# Patient Record
Sex: Female | Born: 1970
Health system: Southern US, Community
[De-identification: ages and names within clinical notes are randomized; demographics above are authoritative.]

## PROBLEM LIST (undated history)

## (undated) DIAGNOSIS — T7840XA Allergy, unspecified, initial encounter: Secondary | ICD-10-CM

## (undated) DIAGNOSIS — Z9989 Dependence on other enabling machines and devices: Secondary | ICD-10-CM

## (undated) DIAGNOSIS — F419 Anxiety disorder, unspecified: Secondary | ICD-10-CM

## (undated) DIAGNOSIS — G4730 Sleep apnea, unspecified: Secondary | ICD-10-CM

## (undated) DIAGNOSIS — Z9884 Bariatric surgery status: Secondary | ICD-10-CM

## (undated) DIAGNOSIS — K8010 Calculus of gallbladder with chronic cholecystitis without obstruction: Secondary | ICD-10-CM

## (undated) DIAGNOSIS — G4733 Obstructive sleep apnea (adult) (pediatric): Secondary | ICD-10-CM

## (undated) DIAGNOSIS — E785 Hyperlipidemia, unspecified: Secondary | ICD-10-CM

## (undated) DIAGNOSIS — Z8669 Personal history of other diseases of the nervous system and sense organs: Secondary | ICD-10-CM

## (undated) DIAGNOSIS — G43909 Migraine, unspecified, not intractable, without status migrainosus: Secondary | ICD-10-CM

## (undated) DIAGNOSIS — K760 Fatty (change of) liver, not elsewhere classified: Secondary | ICD-10-CM

## (undated) DIAGNOSIS — K219 Gastro-esophageal reflux disease without esophagitis: Secondary | ICD-10-CM

## (undated) DIAGNOSIS — R51 Headache: Secondary | ICD-10-CM

## (undated) DIAGNOSIS — I10 Essential (primary) hypertension: Secondary | ICD-10-CM

## (undated) DIAGNOSIS — Z8619 Personal history of other infectious and parasitic diseases: Secondary | ICD-10-CM

## (undated) DIAGNOSIS — E559 Vitamin D deficiency, unspecified: Secondary | ICD-10-CM

## (undated) HISTORY — DX: Allergy, unspecified, initial encounter: T78.40XA

## (undated) HISTORY — DX: Personal history of other diseases of the nervous system and sense organs: Z86.69

## (undated) HISTORY — DX: Morbid (severe) obesity due to excess calories: E66.01

## (undated) HISTORY — DX: Dependence on other enabling machines and devices: Z99.89

## (undated) HISTORY — DX: Essential (primary) hypertension: I10

## (undated) HISTORY — DX: Obstructive sleep apnea (adult) (pediatric): G47.33

## (undated) HISTORY — DX: Anxiety disorder, unspecified: F41.9

## (undated) HISTORY — DX: Personal history of other infectious and parasitic diseases: Z86.19

## (undated) HISTORY — DX: Sleep apnea, unspecified: G47.30

---
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
## 1999-07-23 ENCOUNTER — Other Ambulatory Visit: Admission: RE | Admit: 1999-07-23 | Discharge: 1999-07-23 | Payer: Self-pay | Admitting: *Deleted

## 2002-12-12 ENCOUNTER — Other Ambulatory Visit: Admission: RE | Admit: 2002-12-12 | Discharge: 2002-12-12 | Payer: Self-pay | Admitting: Obstetrics and Gynecology

## 2003-03-02 ENCOUNTER — Ambulatory Visit (HOSPITAL_COMMUNITY): Admission: RE | Admit: 2003-03-02 | Discharge: 2003-03-02 | Payer: Self-pay | Admitting: Obstetrics and Gynecology

## 2003-03-02 ENCOUNTER — Encounter: Payer: Self-pay | Admitting: Obstetrics and Gynecology

## 2003-06-15 ENCOUNTER — Inpatient Hospital Stay (HOSPITAL_COMMUNITY): Admission: AD | Admit: 2003-06-15 | Discharge: 2003-06-15 | Payer: Self-pay | Admitting: Obstetrics and Gynecology

## 2003-06-23 ENCOUNTER — Inpatient Hospital Stay (HOSPITAL_COMMUNITY): Admission: AD | Admit: 2003-06-23 | Discharge: 2003-06-27 | Payer: Self-pay | Admitting: Obstetrics and Gynecology

## 2003-06-24 ENCOUNTER — Encounter (INDEPENDENT_AMBULATORY_CARE_PROVIDER_SITE_OTHER): Payer: Self-pay | Admitting: *Deleted

## 2003-06-28 ENCOUNTER — Encounter: Admission: RE | Admit: 2003-06-28 | Discharge: 2003-07-28 | Payer: Self-pay | Admitting: Obstetrics and Gynecology

## 2003-06-30 ENCOUNTER — Inpatient Hospital Stay (HOSPITAL_COMMUNITY): Admission: AD | Admit: 2003-06-30 | Discharge: 2003-06-30 | Payer: Self-pay | Admitting: Obstetrics and Gynecology

## 2003-07-03 ENCOUNTER — Inpatient Hospital Stay (HOSPITAL_COMMUNITY): Admission: AD | Admit: 2003-07-03 | Discharge: 2003-07-03 | Payer: Self-pay | Admitting: Obstetrics and Gynecology

## 2003-07-25 ENCOUNTER — Other Ambulatory Visit: Admission: RE | Admit: 2003-07-25 | Discharge: 2003-07-25 | Payer: Self-pay | Admitting: Obstetrics and Gynecology

## 2005-03-11 ENCOUNTER — Other Ambulatory Visit: Admission: RE | Admit: 2005-03-11 | Discharge: 2005-03-11 | Payer: Self-pay | Admitting: Family Medicine

## 2006-09-23 ENCOUNTER — Other Ambulatory Visit: Admission: RE | Admit: 2006-09-23 | Discharge: 2006-09-23 | Payer: Self-pay | Admitting: Family Medicine

## 2008-01-03 ENCOUNTER — Other Ambulatory Visit: Admission: RE | Admit: 2008-01-03 | Discharge: 2008-01-03 | Payer: Self-pay | Admitting: Family Medicine

## 2009-11-07 ENCOUNTER — Observation Stay (HOSPITAL_COMMUNITY): Admission: EM | Admit: 2009-11-07 | Discharge: 2009-11-07 | Payer: Self-pay | Admitting: Emergency Medicine

## 2009-12-29 HISTORY — PX: TUBAL LIGATION: SHX77

## 2010-06-02 ENCOUNTER — Inpatient Hospital Stay (HOSPITAL_COMMUNITY): Admission: AD | Admit: 2010-06-02 | Discharge: 2010-06-03 | Payer: Self-pay | Admitting: Obstetrics

## 2010-06-02 ENCOUNTER — Ambulatory Visit: Payer: Self-pay | Admitting: Advanced Practice Midwife

## 2010-07-26 ENCOUNTER — Ambulatory Visit (HOSPITAL_COMMUNITY): Admission: RE | Admit: 2010-07-26 | Discharge: 2010-07-26 | Payer: Self-pay | Admitting: Obstetrics and Gynecology

## 2011-03-15 LAB — BASIC METABOLIC PANEL
BUN: 10 mg/dL (ref 6–23)
CO2: 28 mEq/L (ref 19–32)
Calcium: 9.6 mg/dL (ref 8.4–10.5)
Chloride: 99 mEq/L (ref 96–112)
Creatinine, Ser: 0.8 mg/dL (ref 0.4–1.2)
GFR calc Af Amer: >60 mL/min (ref >60)
GFR calc non Af Amer: >60 mL/min (ref >60)
Glucose, Bld: 98 mg/dL (ref 70–99)
Potassium: 3.2 mEq/L — ABNORMAL LOW (ref 3.5–5.1)
Sodium: 134 mEq/L — ABNORMAL LOW (ref 135–145)

## 2011-03-15 LAB — PREGNANCY, URINE: Preg Test, Ur: NEGATIVE

## 2011-03-15 LAB — CBC
HCT: 38.9 % (ref 36.0–46.0)
Hemoglobin: 13 g/dL (ref 12.0–15.0)
MCH: 28.1 pg (ref 26.0–34.0)
MCHC: 33.3 g/dL (ref 30.0–36.0)
MCV: 84.4 fL (ref 78.0–100.0)
Platelets: 324 10*3/uL (ref 150–400)
RBC: 4.61 MIL/uL (ref 3.87–5.11)
RDW: 15.2 % (ref 11.5–15.5)
WBC: 9 10*3/uL (ref 4.0–10.5)

## 2011-03-17 LAB — GC/CHLAMYDIA PROBE AMP, GENITAL: Chlamydia, DNA Probe: NEGATIVE

## 2011-03-17 LAB — WET PREP, GENITAL
Trich, Wet Prep: NONE SEEN
Yeast Wet Prep HPF POC: NONE SEEN

## 2011-05-16 NOTE — H&P (Signed)
[name], [name]                        ACCOUNT NO.:  [id number]   MEDICAL RECORD NO.:  [id number]                   PATIENT TYPE:  INP   LOCATION:  9175                                 F[facility]ILITY:  [facility]   PHYSICIAN:  Maxie Better, M.D.            DATE OF BIRTH:  December 21, 1971   DATE OF ADMISSION:  06/23/2003  DATE OF DISCHARGE:                                HISTORY & PHYSICAL   CHIEF COMPLAINT:  1. Elevated blood pressure.  2. Induction for superimposed preeclampsia.   HISTORY OF PRESENT ILLNESS:  This is a 40 year old gravida 2, para 0-0-1-0,  married black female, last menstrual period of October 01, 2002, [facility] of July 09, 2003, who is now 37-5/[redacted] weeks gestation, being admitted for induction of  labor secondary to superimposed preeclampsia.  The patient presented for her  obstetrical care on June 23, 2003, at which time her blood pressure was  noted to be 160/90.  The patient denies any headaches or blurring of her  vision.  She has noted leg swelling.  She denies any epigastric pain.   LABORATORY DATA:  On June 16, 2003, had revealed SGOT of 27, uric acid of  6.8, creatinine of 0.9, platelet count of 296,000, hematocrit of 33.5.  She  had a 24 hour urine collection on June 16, 2003, that showed 360 mg of  protein and a 24 hour urine creatinine clearance of 135 ml/min.   The patient's first trimester blood pressure had been 140/98, which is found  to be typically with borderline high blood pressure.  Ultrasound on June 02, 2003, showed an estimated fetal weight of 5 pounds 3 ounces, which is at the  26th percentile.  Amniotic fluid index of 13.9.   PRENATAL CARE:  [facility].   PRIMARY OBSTETRICIAN:  Maxie Better, M.D.   PRENATAL LABORATORY DATA:  Blood type is A positive, antibody screen is  negative.  Hemoglobin electrophoresis is normal.  RPR is nonreactive.  Rubella is immune.  Hepatitis B surface antigen is negative.  HIV test is  negative.  GC and  Chlamydia cultures were negative.  Pap was normal.  AFP 3  test on February 02, 2003, was normal.  Normal anatomic fetal survey at  [facility].  Group B Strep culture was positive.  Glucose challenge  test was normal.   ALLERGIES:  No known drug allergies.   MEDICATIONS:  Prenatal vitamins.   PAST MEDICAL HISTORY:  Borderline hypertension.   PAST SURGICAL HISTORY:  D&E.   OBSTETRICAL HISTORY:  In May 1988, first trimester elective termination, no  complications.   FAMILY HISTORY:  Hypertension in mother and father, uncles and aunts.   SOCIAL HISTORY:  Single, involved, Investment banker, operational.  No  children.   REVIEW OF SYSTEMS:  As per HPI, otherwise negative.   PHYSICAL EXAMINATION:  GENERAL:  A well-developed, well-nourished gravid  black female in no acute distress.  VITAL  SIGNS:  Blood pressure 160/90, weight 250 pounds, fetal heart rate  160.  SKIN:  No lesions.  HEENT:  Anicteric sclerae, pink conjunctivae, oropharynx negative.  HEART:  Regular rate and rhythm with a grade 2 to 3/6 systolic ejection  murmur.  BREASTS:  Soft, nontender, no palpable mass.  ABDOMEN:  Gravid.  Fundal height is 43 cm.  PELVIC:  Soft, closed, vertex, -3.  EXTREMITIES:  There is 1 to 2+ edema, deep tendon reflexes 2+.   IMPRESSION:  1. Superimposed mild preeclampsia.  2. Group B Strep culture positive.  3. Intrauterine gestation at 37+ weeks.   PLAN:  1. Admission.  2. Repeat pregnancy induced hypertension labs.  3. A 24 hour urine creatinine clearance and urine protein collection.  4. Cervical ripening with Cytotec.  5. Low-dose Pitocin.  6. Penicillin prophylaxis.  7. Analgesics p.r.n.                                               Maxie Better, M.D.    [place]/MEDQ  D:  06/23/2003  T:  06/23/2003  Job:  [id number]

## 2011-05-16 NOTE — Discharge Summary (Signed)
[name], [name]                        ACCOUNT NO.:  [id number]   MEDICAL RECORD NO.:  [id number]                   PATIENT TYPE:  INP   LOCATION:  9130                                 [facility]ILITY:  [facility]   PHYSICIAN:  Maxie Better, M.D.            DATE OF BIRTH:  January 15, 1971   DATE OF ADMISSION:  06/23/2003  DATE OF DISCHARGE:  06/27/2003                                 DISCHARGE SUMMARY   ADMISSION DIAGNOSES:  1. Chronic hypertension.  2. Superimposed preeclampsia.  3. Group B strep culture positive.  4. Intrauterine gestation at 68 and five-sevenths weeks.   DISCHARGE DIAGNOSES:  1. Intrauterine gestation at 74 and five-sevenths weeks, delivered.  2. Nonreassuring fetal status.  3. Superimposed preeclampsia.  4. Direct occiput posterior presentation.  5. Postoperative anemia.   PROCEDURE:  Primary cesarean section.   HISTORY OF PRESENT ILLNESS:  This is a 40 year old gravida 2 para 0-0-1-0  married black female at 42 and five-sevenths weeks gestation admitted for  induction secondary to superimposed preeclampsia.  Please see the dictated  H&P for the specific details.   HOSPITAL COURSE:  The patient was admitted to [facility].  She  underwent cervical ripening using Cytotec.  She was noted to be group B  strep culture positive and therefore was started on penicillin.  Magnesium  sulfate was started with the onset of labor.  Low-dose Pitocin was started  on the following morning.  Artificial rupture of membranes was performed at  1 cm dilatation, 80% effaced, and -3.  Clear fluid was noted and internal  scalp electrode was placed at the time for question of deceleration on  external monitoring.  The patient received an epidural at 3 cm, 90%, -3 to -  2, vertex presentation.  Intrauterine pressure catheter was placed.  There  were variable decelerations noted.  Amnioinfusion was started and the  Pitocin was continued.  The patient progressed to 4 cm, 90%, -1;  however,  repetitive late decelerations were noted.  Pitocin was discontinued,  maternal oxygenation was started, positional change was done, but no change  in the tracing was noted and therefore decision was made to perform a  primary cesarean section.  Admission labs had revealed hematocrit of 35.2;  platelet count of 273,000; uric acid of 6.9.  AST was 37, LDH was 220.  The  patient was taken to the operating room where she underwent a primary  cesarean section with the resultant delivery of a live female with the cord  around the neck x1 in the direct occiput posterior presentation, Apgars of 9  and 9, cord pH of 7.23.  The placenta was spontaneous, intact.  It was sent  to pathology and was noted to be a mature placenta.  Normal tubes and  ovaries were noted at the time of surgery.  The weight of the baby was 6  pounds 14 ounces, Apgars of 9 and 9.  Postoperatively  the patient did well.  Her blood pressure during her hospitalization remained between 130 to 150  over 60 to 90.  She was continued on her magnesium sulfate postoperatively  until she was diuresing well.  A CBC on postoperative day #1 showed a  hemoglobin of 10, hematocrit of 30, and a white count of 9.9.  By  postoperative day #3 the patient was off magnesium sulfate, had passed  flatus, was tolerating a regular diet.  Her blood pressures had been stable.  Her incision was dry and intact.  Her uterus was firm at the umbilicus and  nontender and she had 1+ pitting edema bilaterally with no clonus.  A repeat  of her PIH labs showed a mild elevation of her SGOT, uric acid increased to  6.7.  The patient was deemed well otherwise to be discharged home.   DISPOSITION:  Home.   CONDITION:  Stable.   DISCHARGE MEDICATIONS:  1. Tylox #30 one p.o. q.4h. p.r.n. pain.  2. Motrin 800 mg one p.o. q.6h. p.r.n. pain.  3. Over-the-counter iron supplementation one p.o. b.i.d.   DISCHARGE INSTRUCTIONS:  1. Call for temperature greater  than or equal to 100.4  2. Nothing per vagina for four to six weeks.  3. No heavy lifting or driving for two weeks.  4. Call for increased incisional pain, drainage, or redness from the     incision site; severe abdominal pain, nausea, or vomiting; soaking a     regular pad every hour or more frequently.  5. Follow-up appointments in one week for a blood pressure check and in four     weeks for the regular postpartum visit.                                               Maxie Better, M.D.    [place]/MEDQ  D:  09/20/2003  T:  09/20/2003  Job:  [id number]

## 2011-05-16 NOTE — Op Note (Signed)
[name], [name]                        ACCOUNT NO.:  [id number]   MEDICAL RECORD NO.:  [id number]                   PATIENT TYPE:  INP   LOCATION:  9373                                 [facility]   PHYSICIAN:  Maxie Better, M.D.            DATE OF BIRTH:  11-27-1971   DATE OF PROCEDURE:  06/24/2003  DATE OF DISCHARGE:                                 OPERATIVE REPORT   PREOPERATIVE DIAGNOSIS:  Nonreassuring fetal status, superimposed with  preeclampsia, intrauterine gestation at 37 plus weeks.   PROCEDURE:  Primary caesarian section, low transverse uterine incision.   POSTOPERATIVE DIAGNOSIS:  Nonreassuring fetal status, direct occiput  posterior presentation, superimposed preeclampsia, intrauterine gestation at  37 plus weeks.   ANESTHESIA:  Epidural.   SURGEON:  Maxie Better, M.D.   ASSISTANT:  Kathreen Cosier, M.D.   INDICATIONS FOR PROCEDURE:  This is a 40 year old gravida 2, para 0-0-1-0  female at 37-5/[redacted] weeks gestation admitted on June 23, 2003 for induction of  labor secondary to superimposed preeclampsia. The patient is known to be  group B Strep culture positive. She underwent  cervical ripening with  Cytotec x2 and had spontaneous onset of labor. Thereafter she  subsequently  had artificial rupture of membranes, internal fetal scalp electrodes were  subsequently placed, low-dose Pitocin was started. The patient received an  epidural for pain management. She progressed to 4 cm dilatation, -1, 90%  effacement.   During the course of her labor she developed deceleration requiring  intrauterine pressure catheter which resulted in the effusion. She then  started to have retracted late deceleration. Pitocin was discontinued.  Maternal oxygen was given. Positional changes and gap stimulation were  performed. The late deceleration persisted and the decision was made to  proceed with a primary caesarian section.   The risks of the procedure  were explained to the patient. Consent had been  signed. The patient was transferred to the operating room.   DESCRIPTION OF PROCEDURE:  Under adequate epidural anesthesia the patient  was placed in the supine position with a left lateral tilt. An indwelling  Foley catheter was already in place. The patient was sterilely prepped and  draped in the usual fashion. Then 10 mL of 0.25% Marcaine was injected along  the planned incision line.   A Pfannenstiel skin incision was then made and was carried down to the  rectus fascia. The rectus fascia was incised in the midline and extended  bilaterally. The rectus fascia was then bluntly and sharply dissected off  the rectus muscles superiorly and inferiorly. The rectus muscles were split  in the midline. The parietal peritoneum was entered and bluntly extended.   The vesicouterine peritoneum was opened. The bladder was bluntly dissected  off the lower uterine segment and displaced inferiorly using the bladder  retractor. A curvilinear low transverse uterine incision was then made and  extended bilaterally using bandage scissors.   It  was noted that the baby was a live female who was in a direct occiput  position  with the cord around the neck. The baby was delivered. The cord  was reduced. The baby was well suctioned on the abdomen. The cord was  clamped and cut. The baby was transferred to the awaiting pediatrician who  assigned Apgars of 9 at 9 at 1 and 5 minutes.   Cord pH was obtained which was subsequently noted to be 7.23. The placenta  was spontaneous and intact. The uterine cavity was cleaned of debris. No  uterine extension was noted.   The uterine incision was then closed in 2 layers; the first layer was a  running locked stitch of #0 Monocryl. The second layer was an imbricated  using #0 Monocryl suture. Good hemostasis was noted. The abdomen was  copiously irrigated and suctioned of debris.  Normal tubes and ovaries were  noted  bilaterally.   The parietal peritoneum was now closed. The rectus muscle was inspected,  small bleeders were cauterized. The rectus fascia was closed with #0 Vicryl  x2. The subcutaneous area was irrigated and suctioned and small bleeders  were cauterized. The skin was approximated using __________ staples.   The specimen was placenta sent to pathology. Estimated blood loss was 800  cc. Urine output was 200 cc clear yellow urine. Interoperative fluid was  1300 cc crystalloid. The weight of the baby was 6 pounds 14 ounces.  Sponge  and instrument counts x2 were correct. There were no complications.   The patient tolerated the procedure well. She was taken to the recovery room  in stable condition.                                               Maxie Better, M.D.    [place]/MEDQ  D:  06/24/2003  T:  06/25/2003  Job:  [id number]

## 2011-06-16 ENCOUNTER — Other Ambulatory Visit: Payer: Self-pay | Admitting: Family Medicine

## 2011-06-16 ENCOUNTER — Ambulatory Visit
Admission: RE | Admit: 2011-06-16 | Discharge: 2011-06-16 | Disposition: A | Discharge status: Home / Self Care | Payer: 59 | Source: Ambulatory Visit | Attending: Family Medicine | Admitting: Family Medicine

## 2011-06-16 DIAGNOSIS — G44009 Cluster headache syndrome, unspecified, not intractable: Secondary | ICD-10-CM

## 2013-02-04 ENCOUNTER — Encounter (INDEPENDENT_AMBULATORY_CARE_PROVIDER_SITE_OTHER): Payer: Self-pay | Admitting: General Surgery

## 2013-02-04 ENCOUNTER — Ambulatory Visit (INDEPENDENT_AMBULATORY_CARE_PROVIDER_SITE_OTHER): Payer: BC Managed Care – PPO | Admitting: General Surgery

## 2013-02-04 VITALS — BP 126/74 | HR 74 | Temp 98.1°F | Resp 18 | Ht 62.5 in | Wt 263.5 lb

## 2013-02-04 DIAGNOSIS — Z6841 Body Mass Index (BMI) 40.0 and over, adult: Secondary | ICD-10-CM

## 2013-02-04 DIAGNOSIS — G4733 Obstructive sleep apnea (adult) (pediatric): Secondary | ICD-10-CM

## 2013-02-04 DIAGNOSIS — I10 Essential (primary) hypertension: Secondary | ICD-10-CM

## 2013-02-04 NOTE — Progress Notes (Signed)
Patient ID: [name], female   DOB: 08-19-1971, 42 y.o.   MRN: [id number]  Chief Complaint  Patient presents with  . Bariatric Pre-op    Gastric bypass - initial    HPI [name] is a 42 y.o. female.   HPI 42 yo morbidly obese AAF referred by Dr Wynelle Link for evaluation in weight loss surgery. The patient is particularly interested in laparoscopic Roux-en-Y gastric bypass. She states that she has struggled with her weight since her early 55s. Despite numerous attempts for sustained weight loss and she has been unsuccessful. She has tried the [employer], [employer], the [employer], herbal life, Alli - all without any long-term success. She was most successful with the Atkins diet but regained all the weight back plus some additional weight. She works as a Cytogeneticist.  Past Medical History  Diagnosis Date  . Hypertension   . OSA on CPAP     Past Surgical History  Procedure Date  . Cesarean section 05/2005  . Tubal ligation 2011    Family History  Problem Relation Age of Onset  . Cancer Mother     lung  . Emphysema Father   . Heart failure Father   . Cancer Maternal Aunt     breast    Social History History  Substance Use Topics  . Smoking status: Never Smoker   . Smokeless tobacco: Never Used  . Alcohol Use: Yes     Comment: occasional glass of wine or mixed drink 2 or 3 times a month    Allergies  Allergen Reactions  . Lasix (Furosemide) Rash  . Lisinopril Hives, Itching and Swelling    Current Outpatient Prescriptions  Medication Sig Dispense Refill  . atenolol (TENORMIN) 50 MG tablet Take 50 mg by mouth daily.      . hydrochlorothiazide (HYDRODIURIL) 25 MG tablet Take 25 mg by mouth daily.        Review of Systems Review of Systems  Constitutional: Negative for fever, chills and unexpected weight change.  HENT: Negative for hearing loss, nosebleeds, congestion, sore throat, trouble swallowing and voice change.   Eyes:  Negative for photophobia and visual disturbance.  Respiratory: Negative for cough, chest tightness and wheezing.        +OSA on CPAP since 2012  Cardiovascular: Positive for leg swelling. Negative for chest pain and palpitations.       Denies CP, SOB, orthopnea, PND. Some DOE  Gastrointestinal: Negative for nausea, vomiting, abdominal pain, diarrhea, constipation, blood in stool, abdominal distention and anal bleeding.       Denies reflux. BM qod. No bloating.   Genitourinary: Negative for dysuria, urgency, frequency, hematuria, vaginal bleeding and difficulty urinating.       G3P1, regular periods but heavy flow  Musculoskeletal: Positive for back pain. Negative for arthralgias.       +lower back pain - has upcoming appt with Dr Yevette Jones  Skin: Negative for rash and wound.  Neurological: Positive for headaches. Negative for tremors, seizures, syncope and light-headedness.       H/o cluster migraines - none recently; denies TIA, amaurosis fugax  Hematological: Negative for adenopathy. Does not bruise/bleed easily.  Psychiatric/Behavioral: Negative for confusion and self-injury. The patient is not nervous/anxious.     Blood pressure 126/74, pulse 74, temperature 98.1 F (36.7 C), temperature source Temporal, resp. rate 18, height 5' 2.5" (1.588 m), weight 263 lb 8 oz (119.523 kg).  Physical Exam Physical Exam  Vitals reviewed. Constitutional: She  is oriented to person, place, and time. She appears well-developed and well-nourished. No distress.       Morbidly obese  HENT:  Head: Normocephalic and atraumatic.  Right Ear: External ear normal.  Left Ear: External ear normal.  Eyes: Conjunctivae normal are normal. Pupils are equal, round, and reactive to light. No scleral icterus.  Neck: Normal range of motion. Neck supple. No tracheal deviation present. No thyromegaly present.  Cardiovascular: Normal rate, regular rhythm and normal heart sounds.   Pulmonary/Chest: Effort normal and  breath sounds normal. No stridor. No respiratory distress. She has no wheezes.  Abdominal: Soft. She exhibits no distension. There is no tenderness. There is no rebound and no guarding.    Musculoskeletal: Normal range of motion. She exhibits edema (very mild trace b/l ankle edema ). She exhibits no tenderness.  Lymphadenopathy:    She has no cervical adenopathy.  Neurological: She is alert and oriented to person, place, and time.  Skin: Skin is warm and dry. No rash noted. She is not diaphoretic. No erythema. No pallor.  Psychiatric: She has a normal mood and affect. Her behavior is normal. Thought content normal.    Data Reviewed Self reported diet history  Assessment    Morbid obesity BMI 47.4 Hypertension Obstructive sleep apnea on CPAP Low back pain Cluster migraines    Plan    The patient meets weight loss surgery criteria. I think the patient would be an acceptable candidate for Laparoscopic Roux-en-Y Gastric bypass.   We discussed laparoscopic Roux-en-Y gastric bypass. We discussed the preoperative, operative and postoperative process. Using diagrams, I explained the surgery in detail including the performance of an EGD near the end of the surgery and an Upper GI swallow study on POD 1. We discussed the typical hospital course including a 2-3 day stay baring any complications.   The patient was given educational material. I quoted the patient that they can expect to lose 50-70% of their excess weight with the gastric bypass. We did discuss the possibility of weight regain several years after the procedure.  We discussed the risk and benefits of surgery including but not limited to anesthesia risk, bleeding, infection, anastomotic edema requiring a few additional days in the hospital, postop nausea, blood clot formation, anastomotic leak, anastomotic stricture, ulcer formation, death, respiratory complications, intestinal blockage, internal hernia, gallstone formation, vitamin  and nutritional deficiencies, injury to surrounding structures, failure to lose weight and mood changes.  The patient did inquire about vertical sleeve gastrectomy. I discussed the surgical steps and the postoperative course. I also discussed the risk and benefits and potential complications of a laparoscopic vertical sleeve gastrectomy.  We discussed that before and after surgery that there would be an alteration in their diet. I explained that we have put them on a diet 2 weeks before surgery. I also explained that they would be on a liquid diet for 2 weeks after surgery. We discussed that they would have to avoid certain foods such as sugar after surgery. We discussed the importance of physical activity as well as compliance with our dietary and supplement recommendations and routine follow-up.  I explained to the patient that we will start our evaluation process which includes labs, Upper GI to evaluate stomach and swallowing anatomy, nutritionist consultation, psychiatrist consultation, EKG, CXR, abdominal ultrasound.  The patient would like to proceed with workup for laparoscopic Roux-en-Y gastric bypass  Mary Sella. Andrey Campanile, MD, FACS General, Bariatric, & Minimally Invasive Surgery [facility], [place]  Gaynelle Adu M 02/04/2013, 6:19 PM

## 2013-02-04 NOTE — Patient Instructions (Signed)
We will start our work-up. Please call with questions

## 2013-02-14 LAB — CBC WITH DIFFERENTIAL/PLATELET
Basophils Absolute: 0 10*3/uL (ref 0.0–0.1)
Basophils Relative: 0 % (ref 0–1)
Eosinophils Relative: 4 % (ref 0–5)
HCT: 39.3 % (ref 36.0–46.0)
MCHC: 33.3 g/dL (ref 30.0–36.0)
MCV: 80.2 fL (ref 78.0–100.0)
Monocytes Absolute: 0.5 10*3/uL (ref 0.1–1.0)
Platelets: 348 10*3/uL (ref 150–400)
RDW: 15.3 % (ref 11.5–15.5)

## 2013-02-14 LAB — COMPREHENSIVE METABOLIC PANEL
AST: 21 U/L (ref 0–37)
Alkaline Phosphatase: 88 U/L (ref 39–117)
BUN: 11 mg/dL (ref 6–23)
Creat: 0.78 mg/dL (ref 0.50–1.10)
Total Bilirubin: 0.3 mg/dL (ref 0.3–1.2)

## 2013-02-14 LAB — LIPID PANEL
HDL: 55 mg/dL (ref >39)
LDL Cholesterol: 105 mg/dL — ABNORMAL HIGH (ref 0–99)
Total CHOL/HDL Ratio: 3.3 Ratio
Triglycerides: 114 mg/dL (ref <150)
VLDL: 23 mg/dL (ref 0–40)

## 2013-02-16 ENCOUNTER — Ambulatory Visit (HOSPITAL_COMMUNITY)
Admission: RE | Admit: 2013-02-16 | Discharge: 2013-02-16 | Disposition: A | Discharge status: Home / Self Care | Payer: BC Managed Care – PPO | Source: Ambulatory Visit | Attending: General Surgery | Admitting: General Surgery

## 2013-02-16 ENCOUNTER — Other Ambulatory Visit: Payer: Self-pay

## 2013-02-16 DIAGNOSIS — I10 Essential (primary) hypertension: Secondary | ICD-10-CM | POA: Insufficient documentation

## 2013-02-16 DIAGNOSIS — Z6841 Body Mass Index (BMI) 40.0 and over, adult: Secondary | ICD-10-CM | POA: Insufficient documentation

## 2013-02-16 DIAGNOSIS — M545 Low back pain, unspecified: Secondary | ICD-10-CM | POA: Insufficient documentation

## 2013-02-16 DIAGNOSIS — G43909 Migraine, unspecified, not intractable, without status migrainosus: Secondary | ICD-10-CM | POA: Insufficient documentation

## 2013-02-16 DIAGNOSIS — K7689 Other specified diseases of liver: Secondary | ICD-10-CM | POA: Insufficient documentation

## 2013-02-16 DIAGNOSIS — G4733 Obstructive sleep apnea (adult) (pediatric): Secondary | ICD-10-CM | POA: Insufficient documentation

## 2013-02-16 DIAGNOSIS — K449 Diaphragmatic hernia without obstruction or gangrene: Secondary | ICD-10-CM | POA: Insufficient documentation

## 2013-02-16 DIAGNOSIS — E66813 Obesity, class 3: Secondary | ICD-10-CM

## 2013-02-17 ENCOUNTER — Other Ambulatory Visit (INDEPENDENT_AMBULATORY_CARE_PROVIDER_SITE_OTHER): Payer: Self-pay | Admitting: General Surgery

## 2013-02-17 DIAGNOSIS — Z0000 Encounter for general adult medical examination without abnormal findings: Secondary | ICD-10-CM

## 2013-02-18 ENCOUNTER — Encounter: Payer: Self-pay | Admitting: *Deleted

## 2013-02-18 ENCOUNTER — Encounter: Payer: BC Managed Care – PPO | Attending: General Surgery | Admitting: *Deleted

## 2013-02-18 DIAGNOSIS — Z713 Dietary counseling and surveillance: Secondary | ICD-10-CM | POA: Insufficient documentation

## 2013-02-18 DIAGNOSIS — Z01818 Encounter for other preprocedural examination: Secondary | ICD-10-CM | POA: Insufficient documentation

## 2013-02-18 NOTE — Patient Instructions (Addendum)
   Follow Pre-Op Nutrition Goals to prepare for Gastric Bypass Surgery.   Aim for 1200 calories, 150 g carbs, 60-80 g protein, 30 g fat, and 2000 mg or less of sodium daily   Aim for >30 min of physical activity daily. Try water walking.    Call the [facility] at [phone] once you have been given your surgery date to enrolled in the Pre-Op Nutrition Class. You will need to attend this nutrition class 3-4 weeks prior to your surgery.

## 2013-02-18 NOTE — Progress Notes (Addendum)
  Pre-Op Assessment Visit:  Pre-Operative RYGB Surgery/Supervised Weight Loss Visit #1  Medical Nutrition Therapy:  Appt start time: 0800   End time:  0900.  Patient was seen on 02/18/2013 for Pre-Operative RYGB Nutrition Assessment. Assessment and letter of approval faxed to [facility] Bariatric Surgery Program coordinator on 02/18/2013.  Approval letter sent to [facility] center and will be available in the chart under the media tab.  Handouts given during visit include:  Pre-Op Goals   Bariatric Surgery Protein Shakes  Samples given during visit include:   Premier Protein shake Lot: [id number]; Exp: 01/10/14 - 1 ea Lot: [id number]; Exp: 11/05/13  Unjury Protein powder: 1 pkt Lot: [id number]; Exp: 06/15  Patient to call for Pre-Op and Post-Op Nutrition Education at the [facility] when surgery is scheduled.

## 2013-03-03 ENCOUNTER — Ambulatory Visit (HOSPITAL_COMMUNITY)
Admission: RE | Admit: 2013-03-03 | Discharge: 2013-03-03 | Disposition: A | Discharge status: Home / Self Care | Payer: BC Managed Care – PPO | Source: Ambulatory Visit | Attending: General Surgery | Admitting: General Surgery

## 2013-03-03 DIAGNOSIS — Z1231 Encounter for screening mammogram for malignant neoplasm of breast: Secondary | ICD-10-CM | POA: Insufficient documentation

## 2013-03-03 DIAGNOSIS — Z0000 Encounter for general adult medical examination without abnormal findings: Secondary | ICD-10-CM

## 2013-03-21 ENCOUNTER — Ambulatory Visit: Payer: 59 | Admitting: *Deleted

## 2013-04-07 ENCOUNTER — Encounter: Payer: BC Managed Care – PPO | Attending: General Surgery | Admitting: *Deleted

## 2013-04-07 DIAGNOSIS — Z713 Dietary counseling and surveillance: Secondary | ICD-10-CM | POA: Insufficient documentation

## 2013-04-07 DIAGNOSIS — Z01818 Encounter for other preprocedural examination: Secondary | ICD-10-CM | POA: Insufficient documentation

## 2013-04-08 ENCOUNTER — Encounter: Payer: Self-pay | Admitting: *Deleted

## 2013-04-08 NOTE — Progress Notes (Signed)
Supervised Weight Loss Visit on 04/07/2013  Patient here from 9:30 to 10:00 AM for Group Supervised Weight Loss Visit Current weight: 263.3 pounds, states no change in weight since last visit.  She complains of back pain. She states the need childcare for her 44 year old child if she is going to exercise away from home. She attempts to walk either on treadmill or outside, but back pain inhibits much of that. when suggested.  Discussed options for increased activity including water or Arm Chair exercises due to her back pain. She does express interest in pool exercises stating she enjoys being in the water.  Plan, research places with pool for water exercises or obtain books or DVD with Arm Chair exercises she can do at home. Next visit is scheduled for May 8th, 2014

## 2013-05-05 ENCOUNTER — Encounter: Payer: BC Managed Care – PPO | Attending: General Surgery | Admitting: *Deleted

## 2013-05-05 DIAGNOSIS — Z01818 Encounter for other preprocedural examination: Secondary | ICD-10-CM | POA: Insufficient documentation

## 2013-05-05 DIAGNOSIS — Z713 Dietary counseling and surveillance: Secondary | ICD-10-CM | POA: Insufficient documentation

## 2013-05-05 NOTE — Progress Notes (Addendum)
  Supervised Weight Loss Class: Pre-Operative RYGB Surgery  Medical Nutrition Therapy:  Appt start time: 0930  End time: 1000.  Primary concerns today:  Pre-operative bariatric surgery nutrition management.  Weight today: 263.7 lbs Weight change: 0.4 lb Total weight lost: 0 BMI: 47.5  Recent physical activity:  [place]qua zumba at the [facility] several days/week  Estimated daily needs  1200-1300 calories 150-160 g CHO 60g protein 35-40g fat  Progress Towards Goal(s):  In progress.   Nutritional Diagnosis:  [place]-3.3 Obesity related to past poor dietary habits and physical inactivity as evidenced by patient attending supervised weight loss visits for insurance approval of bariatric surgery.    Intervention:  Nutrition education including importance of exercise and how relates to weight loss. Also carb counting.   Monitoring/Evaluation:  Dietary intake, exercise, and body weight. Follow up in 1 month for 4th month supervised weight loss visit.

## 2013-05-05 NOTE — Patient Instructions (Addendum)
Goals:  Eat 3 meals/day, Avoid meal skipping   Have lean, protein rich foods with all carbohydrates  Limit carbohydrate 2 servings/meal and 1 serving/snack  Choose more whole grains, lean protein, low-fat dairy, and fruits/non-starchy vegetables.   Aim for >30 min of physical activity daily  Limit sugar-sweetened beverages and concentrated sweets 

## 2013-06-09 ENCOUNTER — Encounter: Payer: BC Managed Care – PPO | Attending: General Surgery | Admitting: *Deleted

## 2013-06-09 DIAGNOSIS — Z01818 Encounter for other preprocedural examination: Secondary | ICD-10-CM | POA: Insufficient documentation

## 2013-06-09 DIAGNOSIS — Z713 Dietary counseling and surveillance: Secondary | ICD-10-CM | POA: Insufficient documentation

## 2013-07-13 ENCOUNTER — Encounter: Payer: Self-pay | Admitting: *Deleted

## 2013-07-13 NOTE — Patient Instructions (Signed)
Goals:  Eat 3 meals/day, Avoid meal skipping   Have lean, protein rich foods with all carbohydrates  Limit carbohydrate 2 servings/meal and 1 serving/snack  Choose more whole grains, lean protein, low-fat dairy, and fruits/non-starchy vegetables.   Aim for >30 min of physical activity daily  Limit sugar-sweetened beverages and concentrated sweets 

## 2013-07-13 NOTE — Progress Notes (Signed)
  Supervised Weight Loss Class: Pre-Operative RYGB Surgery  Medical Nutrition Therapy:  Appt start time: 0930  End time: 1000.  Primary concerns today:  Pre-operative bariatric surgery nutrition management.  Weight today: 257.9 lbs Weight change: 5.8 lb Total weight lost: 5.8 lb BMI: 46.4 kg/m^2  Recent physical activity:  Continues aqua zumba at the [facility] several days/week  Estimated daily needs  1200-1300 calories 150-160g CHO 60g protein 35-40g fat  Progress Towards Goal(s):  In progress.   Nutritional Diagnosis:  [place]-3.3 Obesity related to past poor dietary habits and physical inactivity as evidenced by patient attending supervised weight loss visits for insurance approval of bariatric surgery.    Intervention:  Nutrition education including importance of exercise and how relates to weight loss. Also discussed handout "Emotional Eating: The Facts".   Monitoring/Evaluation:  Dietary intake, exercise, and body weight. Follow up in 1 month for 5th month supervised weight loss visit.

## 2013-07-21 ENCOUNTER — Encounter: Payer: BC Managed Care – PPO | Attending: General Surgery | Admitting: *Deleted

## 2013-07-21 DIAGNOSIS — Z713 Dietary counseling and surveillance: Secondary | ICD-10-CM | POA: Insufficient documentation

## 2013-07-21 DIAGNOSIS — Z01818 Encounter for other preprocedural examination: Secondary | ICD-10-CM | POA: Insufficient documentation

## 2013-07-24 ENCOUNTER — Encounter: Payer: Self-pay | Admitting: *Deleted

## 2013-07-24 NOTE — Progress Notes (Signed)
  Supervised Weight Loss Visit: Pre-Operative RYGB Surgery  Medical Nutrition Therapy:  Appt start time: 0530  End time: 600.  Primary concerns today:  Supervised Weight Loss  Weight today: 257.6 lbs Weight change: 0.3 lbs Total weight lost: 6.1 lbs BMI: 46.4 kg/m^2  Recent physical activity:  Continues aqua zumba at the [facility] several days/week  Estimated daily needs  1200-1300 calories 150-160g CHO 60g protein 35-40g fat  Progress Towards Goal(s):  In progress.   Nutritional Diagnosis:  [place]-3.3 Obesity related to past poor dietary habits and physical inactivity as evidenced by patient attending supervised weight loss visits for insurance approval of bariatric surgery.    Intervention:  Nutrition education including importance of exercise and how relates to weight loss. Also discussed available apps for smart phones. Specifically, the 7 Minute Workout.     Monitoring/Evaluation:  Dietary intake, exercise, and body weight. Follow up in 1 month for supervised weight loss visit.

## 2013-07-24 NOTE — Patient Instructions (Signed)
Goals:  Eat 3 meals/day, Avoid meal skipping   Have lean, protein rich foods with all carbohydrates  Limit carbohydrate 2 servings/meal and 1 serving/snack  Choose more whole grains, lean protein, low-fat dairy, and fruits/non-starchy vegetables.   Aim for >30 min of physical activity daily  Limit sugar-sweetened beverages and concentrated sweets  Continue to follow Pre-Op Goals.

## 2013-08-19 ENCOUNTER — Ambulatory Visit: Payer: 59 | Admitting: Dietician

## 2013-08-22 ENCOUNTER — Encounter: Payer: BC Managed Care – PPO | Attending: General Surgery | Admitting: Dietician

## 2013-08-22 ENCOUNTER — Ambulatory Visit: Payer: 59 | Admitting: Dietician

## 2013-08-22 DIAGNOSIS — Z713 Dietary counseling and surveillance: Secondary | ICD-10-CM | POA: Insufficient documentation

## 2013-08-22 DIAGNOSIS — Z01818 Encounter for other preprocedural examination: Secondary | ICD-10-CM | POA: Insufficient documentation

## 2013-08-22 NOTE — Patient Instructions (Addendum)
   Contact Sherion Godwin at [facility] to talk about next steps.    Call the [facility] at [phone] once you have been given your surgery date to enrolled in the Pre-Op Nutrition Class. You will need to attend this nutrition class 3-4 weeks prior to your surgery.

## 2013-08-22 NOTE — Progress Notes (Signed)
  Supervised Weight Loss Visit: Pre-Operative RYGB Surgery  Medical Nutrition Therapy:  Appt start time: 1245  End time: 1:00.  Primary concerns today:  Supervised Weight Loss   States she is drinking mostly water and half decaf coffee. Trying to stay away from sweets and trying protein shakes though hasn't found one she likes yet. Working on Dietitian foods in the oven instead of frying.   Weight today: 256.4 lbs Weight change: 1.2 lbs Total weight lost: 7.3 lbs BMI: 46.2 kg/m^2  Recent physical activity:  Going to gym 3 days week for 30 minutes (treadmill, bike, elliptical)  Estimated daily needs  1200-1300 calories 150-160g CHO 60g protein 35-40g fat  Progress Towards Goal(s):  In progress.   Nutritional Diagnosis:  [place]-3.3 Obesity related to past poor dietary habits and physical inactivity as evidenced by patient attending supervised weight loss visits for insurance approval of bariatric surgery.    Intervention:  Nutrition education including importance of following Pre Op goals such as chewing food thoroughly, drinking liquids outside of meal times, finding protein shakes, and eating foods that are not fried or sugary.     Monitoring/Evaluation:  Dietary intake, exercise, and body weight. Will contact [facility] to schedule Pre-Op class once surgery is scheduled

## 2013-09-23 ENCOUNTER — Encounter: Payer: BC Managed Care – PPO | Attending: General Surgery | Admitting: Dietician

## 2013-09-23 VITALS — Ht 62.5 in | Wt 258.2 lb

## 2013-09-23 DIAGNOSIS — Z01818 Encounter for other preprocedural examination: Secondary | ICD-10-CM | POA: Insufficient documentation

## 2013-09-23 DIAGNOSIS — E669 Obesity, unspecified: Secondary | ICD-10-CM

## 2013-09-23 DIAGNOSIS — Z713 Dietary counseling and surveillance: Secondary | ICD-10-CM | POA: Insufficient documentation

## 2013-09-23 NOTE — Progress Notes (Signed)
  Supervised Weight Loss Visit: Pre-Operative RYGB Surgery  Medical Nutrition Therapy:  Appt start time: 845  End time: 9:00.  Primary concerns today:  Supervised Weight Loss  States she is walking 3 x week for 2 miles. Still trying to find a protein shake that she likes.   States she is drinking mostly water and half decaf coffee. Trying to stay away from sweets and trying protein shakes though hasn't found one she likes yet. Working on Dietitian foods in the oven instead of frying.   Weight today: 258.2 lbs Weight change: 1.8 lbs GAIN Total weight lost: 4.8 lbs BMI: 46.5 kg/m^2  Recent physical activity:  Walking outside 2 miles at least 3 x week  Estimated daily needs  1200-1300 calories 150-160g CHO 60g protein 35-40g fat  Progress Towards Goal(s):  In progress.   Nutritional Diagnosis:  [place]-3.3 Obesity related to past poor dietary habits and physical inactivity as evidenced by patient attending supervised weight loss visits for insurance approval of bariatric surgery.    Intervention:  Nutrition education provided. Recommended continuing with walking outside she she like it better than going to the gym and continue working on limiting sweets and fried foods.     Monitoring/Evaluation:  Dietary intake, exercise, and body weight. Follow up in 4 weeks for final Supervised Weight Loss.

## 2013-09-23 NOTE — Patient Instructions (Addendum)
   Call the [facility] at [phone] once you have been given your surgery date to enrolled in the Pre-Op Nutrition Class. You will need to attend this nutrition class 3-4 weeks prior to your surgery.   Look for a protein shake that has 15 g or more of protein and less than 5 g of carbs per serving.

## 2013-09-26 ENCOUNTER — Ambulatory Visit: Payer: 59 | Admitting: Dietician

## 2013-10-26 ENCOUNTER — Encounter: Payer: BC Managed Care – PPO | Attending: General Surgery | Admitting: Dietician

## 2013-10-26 VITALS — Ht 62.5 in | Wt 251.7 lb

## 2013-10-26 DIAGNOSIS — E669 Obesity, unspecified: Secondary | ICD-10-CM

## 2013-10-26 DIAGNOSIS — Z01818 Encounter for other preprocedural examination: Secondary | ICD-10-CM | POA: Insufficient documentation

## 2013-10-26 DIAGNOSIS — Z713 Dietary counseling and surveillance: Secondary | ICD-10-CM | POA: Insufficient documentation

## 2013-10-26 NOTE — Progress Notes (Signed)
  Supervised Weight Loss Visit: Pre-Operative RYGB Surgery  Medical Nutrition Therapy:  Appt start time: 900  End time: 915.  Primary concerns today:  Supervised Weight Loss  [name] has been eating a lot of produce and has been doing a lot of cooking. States she has been walking about 4-5 x week and started bowling at night on Tuesdays. Drinking mostly water and half decaf coffee. Found that she likes the EAS protein shakes.   Weight today: 251.7 lbs Weight change: 6.5 lbs Lost Total weight lost: 11.3 lbs BMI: 45.3 kg/m^2  Recent physical activity:  4-5 x week and started bowling at night on Tuesdays  Estimated daily needs  1200-1300 calories 150-160g CHO 60g protein 35-40g fat  Progress Towards Goal(s):  In progress.   Nutritional Diagnosis:  [place]-3.3 Obesity related to past poor dietary habits and physical inactivity as evidenced by patient attending supervised weight loss visits for insurance approval of bariatric surgery.    Intervention:  Nutrition education provided. Recommended that [name] keep exercising and work on eliminating caffeine completely before surgery.     Monitoring/Evaluation:  Dietary intake, exercise, and body weight. Follow up in 4 weeks for final Supervised Weight Loss.

## 2013-10-26 NOTE — Patient Instructions (Addendum)
   Call the [facility] at [phone] once you have been given your surgery date to enrolled in the Pre-Op Nutrition Class. You will need to attend this nutrition class 3-4 weeks prior to your surgery.

## 2014-01-26 ENCOUNTER — Other Ambulatory Visit (INDEPENDENT_AMBULATORY_CARE_PROVIDER_SITE_OTHER): Payer: Self-pay | Admitting: General Surgery

## 2014-02-02 ENCOUNTER — Encounter: Payer: BC Managed Care – PPO | Attending: General Surgery

## 2014-02-02 DIAGNOSIS — Z713 Dietary counseling and surveillance: Secondary | ICD-10-CM | POA: Insufficient documentation

## 2014-02-02 DIAGNOSIS — Z9884 Bariatric surgery status: Secondary | ICD-10-CM | POA: Insufficient documentation

## 2014-02-03 NOTE — Patient Instructions (Signed)
Follow:   Pre-Op Diet per MD 2 weeks prior to surgery  Phase 2- Liquids (clear/full) 2 weeks after surgery  Vitamin/Mineral/Calcium guidelines for purchasing bariatric supplements  Exercise guidelines pre and post-op per MD  Follow-up at [facility] in 2 weeks post-op for diet advancement. Contact Sara Himmelrich as needed with questions/concerns.

## 2014-02-03 NOTE — Progress Notes (Signed)
Pre-Operative Nutrition Class:  Appt start time: 3073   End time:  1830.  Patient was seen on 02/02/2014 for Pre-Operative Bariatric Surgery Education at [facility].   Surgery date: 02/27/2014 Surgery type: RYGB Start weight at [facility]: 263 lbs on 04/07/13 Weight today: 256 lbs  TANITA  BODY COMP RESULTS  02/02/14   BMI (kg/m^2) 46.1   Fat Mass (lbs) 137.0   Fat Free Mass (lbs) 119.0   Total Body Water (lbs) 87.0   Samples given per MNT protocol. Patient educated on appropriate usage: BariActiv Multivitamin Lot # [id number] S Exp: 5/16  BariActiv Calcium Citrate Lot # [id number] S Exp: 6/16  BariActiv Iron Lot # [id number] S Exp: 5/16  Premier Vanilla Protein Powder Lot # [id number] Exp: 09/28/2014  The following the learning objectives were met by the patient during this course:  Identify Pre-Op Dietary Goals and will begin 2 weeks pre-operatively  Identify appropriate sources of fluids and proteins   State protein recommendations and appropriate sources pre and post-operatively  Identify Post-Operative Dietary Goals and will follow for 2 weeks post-operatively  Identify appropriate multivitamin and calcium sources  Describe the need for physical activity post-operatively and will follow MD recommendations  State when to call healthcare provider regarding medication questions or post-operative complications  Handouts given during class include:  Pre-Op Bariatric Surgery Diet Handout  Protein Shake Handout  Post-Op Bariatric Surgery Nutrition Handout  BELT Program Information Flyer  Support Group Information Flyer  [facility] Bariatric Supplements Price List  Follow-Up Plan: Patient will follow-up at [facility] 2 weeks post operatively for diet advancement per MD.

## 2014-02-17 ENCOUNTER — Encounter (HOSPITAL_COMMUNITY): Payer: Self-pay | Admitting: Pharmacy Technician

## 2014-02-21 ENCOUNTER — Encounter (INDEPENDENT_AMBULATORY_CARE_PROVIDER_SITE_OTHER): Payer: Self-pay

## 2014-02-21 ENCOUNTER — Other Ambulatory Visit (HOSPITAL_COMMUNITY): Payer: Self-pay | Admitting: *Deleted

## 2014-02-21 ENCOUNTER — Ambulatory Visit (INDEPENDENT_AMBULATORY_CARE_PROVIDER_SITE_OTHER): Payer: BC Managed Care – PPO | Admitting: General Surgery

## 2014-02-21 ENCOUNTER — Encounter (INDEPENDENT_AMBULATORY_CARE_PROVIDER_SITE_OTHER): Payer: Self-pay | Admitting: General Surgery

## 2014-02-21 VITALS — BP 128/84 | HR 64 | Temp 98.2°F | Resp 16 | Ht 62.5 in | Wt 248.2 lb

## 2014-02-21 DIAGNOSIS — E66813 Obesity, class 3: Secondary | ICD-10-CM | POA: Insufficient documentation

## 2014-02-21 MED ORDER — OXYCODONE HCL 5 MG/5ML PO SOLN: 5.0000 mg | ORAL | Status: DC | PRN

## 2014-02-21 NOTE — Patient Instructions (Addendum)
ETOLA MULL  02/21/2014                           YOUR PROCEDURE IS SCHEDULED ON: 02/27/14               PLEASE REPORT TO SHORT STAY CENTER AT :  5:15 AM               CALL THIS NUMBER IF ANY PROBLEMS THE DAY OF SURGERY :               [phone]                                REMEMBER:   Do not eat food or drink liquids AFTER MIDNIGHT                  Take these medicines the morning of surgery with A SIP OF WATER:  ATENOLOL   Do not wear jewelry, make-up   Do not wear lotions, powders, or perfumes.   Do not shave legs or underarms 12 hrs. before surgery (men may shave face)  Do not bring valuables to the hospital.  Contacts, dentures or bridgework may not be worn into surgery.  Leave suitcase in the car. After surgery it may be brought to your room.  For patients admitted to the hospital more than one night, checkout time is 11:00 AM                                                       The day of discharge.   Patients discharged the day of surgery will not be allowed to drive home.                If going home same day of surgery, must have someone stay with you            FIRST 24 hrs at home and arrange for some one to drive you home from hospital.    Special Instructions             Please read over the following fact sheets that you were given:               1. [place]               2. FOLLOW BOWEL PREP               3. [place] / HERBS / IBUPROFIN / MOTRIN / ALEVE 7 DAYS PREOP               4. STOP HORMONES 4 WEEKS PREOP               5. BRING C PAP MASK AND [place]                                                X_____________________________________________________________________        Failure to follow these instructions may result in cancellation of your surgery

## 2014-02-21 NOTE — Patient Instructions (Signed)
Get pain medication prescription filled Please call with any questions Keep up the good work

## 2014-02-21 NOTE — Progress Notes (Signed)
Patient ID: [name], female   DOB: 11/22/1971, 43 y.o.   MRN: [id number]  Chief complaint: Here to talk about surgery  HPI [name] is a 43 y.o. female.   HPI 43 year old morbidly obese African American female comes in today for her preop appointment. She is currently scheduled for laparoscopic Roux-en-Y gastric bypass with possible hiatal hernia repair on March 2. I last saw her in the office in February 2014. She had to undergo physician supervised weight loss. Otherwise she denies any significant changes to her medical history since she was last seen. She did have some right foot pain when she puts pressure on it several months ago however that has resolved. She is still using CPAP. Past Medical History  Diagnosis Date  . Hypertension   . OSA on CPAP   . Morbid obesity     Past Surgical History  Procedure Laterality Date  . Cesarean section  05/2005  . Tubal ligation  2011    Family History  Problem Relation Age of Onset  . Cancer Mother     lung  . Emphysema Father   . Heart failure Father   . Cancer Maternal Aunt     breast    Social History History  Substance Use Topics  . Smoking status: Never Smoker   . Smokeless tobacco: Never Used  . Alcohol Use: Yes     Comment: occasional glass of wine or mixed drink 2 or 3 times a month    Allergies  Allergen Reactions  . Lasix [Furosemide] Rash  . Lisinopril Hives, Itching and Swelling    Current Outpatient Prescriptions  Medication Sig Dispense Refill  . acetaminophen (TYLENOL) 500 MG tablet Take 1,000 mg by mouth every 6 (six) hours as needed for mild pain or moderate pain.      Marland Kitchen atenolol (TENORMIN) 50 MG tablet Take 50 mg by mouth every morning.       . hydrochlorothiazide (HYDRODIURIL) 25 MG tablet Take 25 mg by mouth every morning.       Marland Kitchen oxyCODONE (ROXICODONE) 5 MG/5ML solution Take 5-10 mLs (5-10 mg total) by mouth every 4 (four) hours as needed for severe pain. After surgery  200 mL  0   No  current facility-administered medications for this visit.    Review of Systems Review of Systems  Constitutional: Negative for fever, activity change, appetite change and unexpected weight change.  HENT: Negative for nosebleeds and trouble swallowing.   Eyes: Negative for photophobia and visual disturbance.  Respiratory: Negative for chest tightness and shortness of breath.        Uses CPAP  Cardiovascular: Negative for chest pain and leg swelling.       Denies CP, SOB, orthopnea, PND, DOE  Gastrointestinal: Negative for nausea, vomiting, abdominal pain and diarrhea.       Denies reflux  Genitourinary: Negative for dysuria and difficulty urinating.  Musculoskeletal: Negative for arthralgias.  Skin: Negative for pallor and rash.  Neurological: Positive for headaches. Negative for dizziness, seizures, facial asymmetry and numbness.       Denies TIA and amaurosis fugax   Hematological: Negative for adenopathy. Does not bruise/bleed easily.  Psychiatric/Behavioral: Negative for behavioral problems and agitation.    Blood pressure 128/84, pulse 64, temperature 98.2 F (36.8 C), resp. rate 16, height 5' 2.5" (1.588 m), weight 248 lb 3.2 oz (112.583 kg).  Physical Exam Physical Exam  Vitals reviewed. Constitutional: She is oriented to person, place, and time. She appears well-developed and  well-nourished. No distress.  HENT:  Head: Normocephalic and atraumatic.  Right Ear: External ear normal.  Left Ear: External ear normal.  Eyes: Conjunctivae are normal. No scleral icterus.  Neck: Normal range of motion. Neck supple. No tracheal deviation present. No thyromegaly present.  Cardiovascular: Normal rate, normal heart sounds and intact distal pulses.   Pulmonary/Chest: Effort normal and breath sounds normal. No respiratory distress. She has no wheezes.  Abdominal: Soft. She exhibits no distension. There is no tenderness. There is no rebound.    Musculoskeletal: Normal range of motion.  She exhibits no edema and no tenderness.  Lymphadenopathy:    She has no cervical adenopathy.  Neurological: She is alert and oriented to person, place, and time. She exhibits normal muscle tone.  Skin: Skin is warm and dry. No rash noted. She is not diaphoretic. No erythema. No pallor.  Psychiatric: She has a normal mood and affect. Her behavior is normal. Judgment and thought content normal.    Data Reviewed UGI - small hiatal hernia abd u/s - fatty liver Initial visit labs - essentially normal - LDL 105  Assessment    Morbid obesity 44.67 Fatty liver Hiatal hernia HTN OSA on CPAP Low back pain migraine     Plan    We reviewed her bariatric surgery workup and discuss her labs as well as imaging results. We discussed the finding of a small hiatal hernia. I explained that we would test her for a hiatal hernia during surgery and this found a significant hernia we would proceed with interoperative repair by bringing the diaphragm muscles back together with sutures. All of her questions were asked and answered. She was given her postoperative pain medicine prescription of oxycodone elixir today so she could go ahead and have that filled. We discussed the importance of the preoperative diet. I congratulated her on her weight loss. She was encouraged to contact me if she has any questions between now and surgery  [name]. Redmond Pulling, MD, FACS General, Bariatric, & Minimally Invasive Surgery [facility], [place]        [facility] M 02/21/2014, 4:39 PM

## 2014-02-22 ENCOUNTER — Encounter (HOSPITAL_COMMUNITY)
Admission: RE | Admit: 2014-02-22 | Discharge: 2014-02-22 | Disposition: A | Discharge status: Home / Self Care | Payer: BC Managed Care – PPO | Source: Ambulatory Visit | Attending: General Surgery | Admitting: General Surgery

## 2014-02-22 ENCOUNTER — Encounter (HOSPITAL_COMMUNITY): Payer: Self-pay

## 2014-02-22 ENCOUNTER — Telehealth (INDEPENDENT_AMBULATORY_CARE_PROVIDER_SITE_OTHER): Payer: Self-pay | Admitting: *Deleted

## 2014-02-22 ENCOUNTER — Ambulatory Visit (HOSPITAL_COMMUNITY)
Admission: RE | Admit: 2014-02-22 | Discharge: 2014-02-22 | Disposition: A | Discharge status: Home / Self Care | Payer: BC Managed Care – PPO | Source: Ambulatory Visit | Attending: General Surgery | Admitting: General Surgery

## 2014-02-22 DIAGNOSIS — I498 Other specified cardiac arrhythmias: Secondary | ICD-10-CM | POA: Insufficient documentation

## 2014-02-22 DIAGNOSIS — R9431 Abnormal electrocardiogram [ECG] [EKG]: Secondary | ICD-10-CM | POA: Insufficient documentation

## 2014-02-22 DIAGNOSIS — Z01818 Encounter for other preprocedural examination: Secondary | ICD-10-CM | POA: Insufficient documentation

## 2014-02-22 DIAGNOSIS — Z01810 Encounter for preprocedural cardiovascular examination: Secondary | ICD-10-CM | POA: Insufficient documentation

## 2014-02-22 DIAGNOSIS — Z01812 Encounter for preprocedural laboratory examination: Secondary | ICD-10-CM | POA: Insufficient documentation

## 2014-02-22 HISTORY — DX: Headache: R51

## 2014-02-22 LAB — COMPREHENSIVE METABOLIC PANEL
ALBUMIN: 4.4 g/dL (ref 3.5–5.2)
ALT: 22 U/L (ref 0–35)
AST: 23 U/L (ref 0–37)
Alkaline Phosphatase: 82 U/L (ref 39–117)
BILIRUBIN TOTAL: 0.4 mg/dL (ref 0.3–1.2)
BUN: 14 mg/dL (ref 6–23)
CALCIUM: 11.1 mg/dL — AB (ref 8.4–10.5)
CO2: 26 mEq/L (ref 19–32)
CREATININE: 0.81 mg/dL (ref 0.50–1.10)
Chloride: 96 mEq/L (ref 96–112)
GFR calc Af Amer: >90 mL/min (ref >90)
GFR calc non Af Amer: 87 mL/min — ABNORMAL LOW (ref >90)
Glucose, Bld: 99 mg/dL (ref 70–99)
Potassium: 4 mEq/L (ref 3.7–5.3)
Sodium: 137 mEq/L (ref 137–147)
Total Protein: 8.9 g/dL — ABNORMAL HIGH (ref 6.0–8.3)

## 2014-02-22 LAB — CBC WITH DIFFERENTIAL/PLATELET
BASOS ABS: 0 10*3/uL (ref 0.0–0.1)
BASOS PCT: 0 % (ref 0–1)
EOS PCT: 1 % (ref 0–5)
Eosinophils Absolute: 0.1 10*3/uL (ref 0.0–0.7)
HEMATOCRIT: 40.9 % (ref 36.0–46.0)
HEMOGLOBIN: 13.7 g/dL (ref 12.0–15.0)
Lymphocytes Relative: 31 % (ref 12–46)
Lymphs Abs: 2.8 10*3/uL (ref 0.7–4.0)
MCH: 27 pg (ref 26.0–34.0)
MCHC: 33.5 g/dL (ref 30.0–36.0)
MCV: 80.5 fL (ref 78.0–100.0)
MONO ABS: 0.6 10*3/uL (ref 0.1–1.0)
MONOS PCT: 7 % (ref 3–12)
Neutro Abs: 5.7 10*3/uL (ref 1.7–7.7)
Neutrophils Relative %: 62 % (ref 43–77)
Platelets: 338 10*3/uL (ref 150–400)
RBC: 5.08 MIL/uL (ref 3.87–5.11)
RDW: 13.8 % (ref 11.5–15.5)
WBC: 9.2 10*3/uL (ref 4.0–10.5)

## 2014-02-22 LAB — HCG, SERUM, QUALITATIVE: Preg, Serum: NEGATIVE

## 2014-02-22 NOTE — Telephone Encounter (Signed)
Clears day before and Miralax prep

## 2014-02-22 NOTE — Telephone Encounter (Signed)
[name] with Pre-op called to state that the patient told her she did not receive a bowel prep. Patient is schedule for a RNY on 02/27/14.  Explained that I will send a message to Dr. Redmond Pulling to find out which bowel prep and we will get it sent out to the patient.  Carlyon Shadow will let the patient know at this time.

## 2014-02-22 NOTE — Telephone Encounter (Signed)
Called patient to let her know that she will need to do clears day before and Miralax and I will mail out bowel prep op to her home

## 2014-02-23 NOTE — Anesthesia Preprocedure Evaluation (Addendum)
Anesthesia Evaluation  Patient identified by MRN, date of birth, ID band Patient awake    Reviewed: Allergy & Precautions, H&P , NPO status , Patient's Chart, lab work & pertinent test results  Airway Mallampati: III TM Distance: <3 FB Neck ROM: Full    Dental no notable dental hx.    Pulmonary sleep apnea and Continuous Positive Airway Pressure Ventilation ,  breath sounds clear to auscultation  Pulmonary exam normal       Cardiovascular hypertension, Pt. on medications and Pt. on home beta blockers Rhythm:Regular Rate:Normal     Neuro/Psych negative neurological ROS  negative psych ROS   GI/Hepatic negative GI ROS, Neg liver ROS,   Endo/Other  Morbid obesity  Renal/GU negative Renal ROS  negative genitourinary   Musculoskeletal negative musculoskeletal ROS (+)   Abdominal   Peds negative pediatric ROS (+)  Hematology negative hematology ROS (+)   Anesthesia Other Findings   Reproductive/Obstetrics negative OB ROS                          Anesthesia Physical Anesthesia Plan  ASA: III  Anesthesia Plan: General   Post-op Pain Management:    Induction: Intravenous  Airway Management Planned: Oral ETT  Additional Equipment:   Intra-op Plan:   Post-operative Plan: Extubation in OR  Informed Consent: I have reviewed the patients History and Physical, chart, labs and discussed the procedure including the risks, benefits and alternatives for the proposed anesthesia with the patient or authorized representative who has indicated his/her understanding and acceptance.   Dental advisory given  Plan Discussed with: CRNA and Surgeon  Anesthesia Plan Comments:         Anesthesia Quick Evaluation

## 2014-02-24 ENCOUNTER — Telehealth (INDEPENDENT_AMBULATORY_CARE_PROVIDER_SITE_OTHER): Payer: Self-pay | Admitting: General Surgery

## 2014-02-24 NOTE — Telephone Encounter (Signed)
Pt called because she was expecting bowel prep instructions in the mail today, but they did not come in today's mail.  Her surgery is Monday, so went over all the instructions on the phone.  She will buy Dulcolax tabs, Miralax and Gatorade.  Pt read all instructions back and understands all.

## 2014-02-27 ENCOUNTER — Inpatient Hospital Stay (HOSPITAL_COMMUNITY): Payer: BC Managed Care – PPO | Admitting: Anesthesiology

## 2014-02-27 ENCOUNTER — Encounter (HOSPITAL_COMMUNITY): Payer: Self-pay | Admitting: *Deleted

## 2014-02-27 ENCOUNTER — Inpatient Hospital Stay (HOSPITAL_COMMUNITY)
Admission: RE | Admit: 2014-02-27 | Discharge: 2014-03-01 | DRG: 621 | Disposition: A | Discharge status: Home / Self Care | Payer: BC Managed Care – PPO | Source: Ambulatory Visit | Attending: General Surgery | Admitting: General Surgery

## 2014-02-27 ENCOUNTER — Encounter (HOSPITAL_COMMUNITY): Payer: BC Managed Care – PPO | Admitting: Anesthesiology

## 2014-02-27 ENCOUNTER — Encounter (HOSPITAL_COMMUNITY)
Admission: RE | Disposition: A | Discharge status: Home / Self Care | Payer: Self-pay | Source: Ambulatory Visit | Attending: General Surgery

## 2014-02-27 DIAGNOSIS — M545 Low back pain, unspecified: Secondary | ICD-10-CM | POA: Diagnosis present

## 2014-02-27 DIAGNOSIS — Z9989 Dependence on other enabling machines and devices: Secondary | ICD-10-CM

## 2014-02-27 DIAGNOSIS — G4733 Obstructive sleep apnea (adult) (pediatric): Secondary | ICD-10-CM

## 2014-02-27 DIAGNOSIS — G43909 Migraine, unspecified, not intractable, without status migrainosus: Secondary | ICD-10-CM | POA: Diagnosis present

## 2014-02-27 DIAGNOSIS — I10 Essential (primary) hypertension: Secondary | ICD-10-CM | POA: Diagnosis present

## 2014-02-27 DIAGNOSIS — K7689 Other specified diseases of liver: Secondary | ICD-10-CM | POA: Diagnosis present

## 2014-02-27 DIAGNOSIS — R143 Flatulence: Secondary | ICD-10-CM

## 2014-02-27 DIAGNOSIS — E669 Obesity, unspecified: Secondary | ICD-10-CM

## 2014-02-27 DIAGNOSIS — R110 Nausea: Secondary | ICD-10-CM | POA: Diagnosis not present

## 2014-02-27 DIAGNOSIS — K449 Diaphragmatic hernia without obstruction or gangrene: Secondary | ICD-10-CM

## 2014-02-27 DIAGNOSIS — Z6841 Body Mass Index (BMI) 40.0 and over, adult: Secondary | ICD-10-CM

## 2014-02-27 DIAGNOSIS — R141 Gas pain: Secondary | ICD-10-CM | POA: Diagnosis not present

## 2014-02-27 DIAGNOSIS — Z9884 Bariatric surgery status: Secondary | ICD-10-CM

## 2014-02-27 DIAGNOSIS — R142 Eructation: Secondary | ICD-10-CM | POA: Diagnosis not present

## 2014-02-27 DIAGNOSIS — Z8249 Family history of ischemic heart disease and other diseases of the circulatory system: Secondary | ICD-10-CM

## 2014-02-27 HISTORY — PX: GASTRIC ROUX-EN-Y: SHX5262

## 2014-02-27 LAB — HEMOGLOBIN AND HEMATOCRIT, BLOOD
HEMATOCRIT: 38.3 % (ref 36.0–46.0)
HEMOGLOBIN: 12.7 g/dL (ref 12.0–15.0)

## 2014-02-27 SURGERY — LAPAROSCOPIC ROUX-EN-Y GASTRIC BYPASS WITH UPPER ENDOSCOPY
Anesthesia: General | Site: Abdomen

## 2014-02-27 MED ORDER — DEXTROSE 5 % IV SOLN
2.0000 g | INTRAVENOUS | Status: AC
  Administered 2014-02-27: 2 g via INTRAVENOUS
  Filled 2014-02-27: qty 2

## 2014-02-27 MED ORDER — BUPIVACAINE-EPINEPHRINE 0.25% -1:200000 IJ SOLN
INTRAMUSCULAR | Status: DC | PRN
  Administered 2014-02-27: 40 mL

## 2014-02-27 MED ORDER — HYDROMORPHONE HCL PF 1 MG/ML IJ SOLN
INTRAMUSCULAR | Status: AC
  Filled 2014-02-27: qty 1

## 2014-02-27 MED ORDER — SUFENTANIL CITRATE 50 MCG/ML IV SOLN
INTRAVENOUS | Status: DC | PRN
  Administered 2014-02-27 (×4): 5 ug via INTRAVENOUS
  Administered 2014-02-27 (×2): 10 ug via INTRAVENOUS
  Administered 2014-02-27 (×2): 5 ug via INTRAVENOUS

## 2014-02-27 MED ORDER — SUFENTANIL CITRATE 50 MCG/ML IV SOLN
INTRAVENOUS | Status: AC
  Filled 2014-02-27: qty 1

## 2014-02-27 MED ORDER — MORPHINE SULFATE 10 MG/ML IJ SOLN
2.0000 mg | INTRAMUSCULAR | Status: DC | PRN
  Administered 2014-02-27 – 2014-02-28 (×2): 6 mg via INTRAVENOUS
  Administered 2014-02-28: 2 mg via INTRAVENOUS
  Filled 2014-02-27 (×3): qty 1

## 2014-02-27 MED ORDER — ROCURONIUM BROMIDE 100 MG/10ML IV SOLN: INTRAVENOUS | Status: DC | PRN

## 2014-02-27 MED ORDER — DEXAMETHASONE SODIUM PHOSPHATE 10 MG/ML IJ SOLN
INTRAMUSCULAR | Status: DC | PRN
  Administered 2014-02-27: 10 mg via INTRAVENOUS

## 2014-02-27 MED ORDER — DEXTROSE 5 % IV SOLN
INTRAVENOUS | Status: AC
  Filled 2014-02-27 (×2): qty 1

## 2014-02-27 MED ORDER — EPHEDRINE SULFATE 50 MG/ML IJ SOLN
INTRAMUSCULAR | Status: AC
  Filled 2014-02-27: qty 1

## 2014-02-27 MED ORDER — HEPARIN SODIUM (PORCINE) 5000 UNIT/ML IJ SOLN
5000.0000 [IU] | INTRAMUSCULAR | Status: AC
  Administered 2014-02-27: 5000 [IU] via SUBCUTANEOUS
  Filled 2014-02-27: qty 1

## 2014-02-27 MED ORDER — UNJURY CHOCOLATE CLASSIC POWDER: 2.0000 [oz_av] | Freq: Four times a day (QID) | ORAL | Status: DC

## 2014-02-27 MED ORDER — MORPHINE SULFATE 4 MG/ML IJ SOLN
INTRAMUSCULAR | Status: AC
  Administered 2014-02-27: 17:00:00
  Filled 2014-02-27: qty 1

## 2014-02-27 MED ORDER — MIDAZOLAM HCL 2 MG/2ML IJ SOLN
INTRAMUSCULAR | Status: AC
  Filled 2014-02-27: qty 2

## 2014-02-27 MED ORDER — ACETAMINOPHEN 160 MG/5ML PO SOLN: 325.0000 mg | ORAL | Status: DC | PRN

## 2014-02-27 MED ORDER — LACTATED RINGERS IV SOLN
INTRAVENOUS | Status: DC | PRN
  Administered 2014-02-27 (×2): via INTRAVENOUS

## 2014-02-27 MED ORDER — GLYCOPYRROLATE 0.2 MG/ML IJ SOLN
INTRAMUSCULAR | Status: AC
  Filled 2014-02-27: qty 2

## 2014-02-27 MED ORDER — NEOSTIGMINE METHYLSULFATE 1 MG/ML IJ SOLN
INTRAMUSCULAR | Status: AC
  Filled 2014-02-27: qty 10

## 2014-02-27 MED ORDER — ONDANSETRON HCL 4 MG/2ML IJ SOLN
INTRAMUSCULAR | Status: DC | PRN
  Administered 2014-02-27: 4 mg via INTRAVENOUS

## 2014-02-27 MED ORDER — KCL IN DEXTROSE-NACL 20-5-0.45 MEQ/L-%-% IV SOLN
INTRAVENOUS | Status: DC
  Administered 2014-02-27 – 2014-02-28 (×4): via INTRAVENOUS
  Filled 2014-02-27 (×6): qty 1000

## 2014-02-27 MED ORDER — TISSEEL VH 10 ML EX KIT
PACK | CUTANEOUS | Status: DC | PRN
  Administered 2014-02-27 (×2): 10 mL

## 2014-02-27 MED ORDER — GLYCOPYRROLATE 0.2 MG/ML IJ SOLN
INTRAMUSCULAR | Status: AC
  Filled 2014-02-27: qty 3

## 2014-02-27 MED ORDER — METOPROLOL TARTRATE 1 MG/ML IV SOLN
5.0000 mg | Freq: Four times a day (QID) | INTRAVENOUS | Status: DC
  Administered 2014-02-27: 5 mg via INTRAVENOUS
  Filled 2014-02-27 (×15): qty 5

## 2014-02-27 MED ORDER — GLYCOPYRROLATE 0.2 MG/ML IJ SOLN
INTRAMUSCULAR | Status: DC | PRN
  Administered 2014-02-27: 0.1 mg via INTRAVENOUS
  Administered 2014-02-27: .8 mg via INTRAVENOUS
  Administered 2014-02-27: 0.1 mg via INTRAVENOUS

## 2014-02-27 MED ORDER — CISATRACURIUM BESYLATE (PF) 10 MG/5ML IV SOLN
INTRAVENOUS | Status: DC | PRN
  Administered 2014-02-27: 2 mg via INTRAVENOUS
  Administered 2014-02-27: 4 mg via INTRAVENOUS
  Administered 2014-02-27: 2 mg via INTRAVENOUS

## 2014-02-27 MED ORDER — OXYCODONE HCL 5 MG/5ML PO SOLN
5.0000 mg | ORAL | Status: DC | PRN
  Administered 2014-02-28 (×2): 5 mg via ORAL
  Administered 2014-03-01: 10 mg via ORAL
  Filled 2014-02-27: qty 5
  Filled 2014-02-27: qty 10
  Filled 2014-02-27: qty 5

## 2014-02-27 MED ORDER — TISSEEL VH 10 ML EX KIT
PACK | CUTANEOUS | Status: AC
  Filled 2014-02-27: qty 1

## 2014-02-27 MED ORDER — UNJURY VANILLA POWDER: 2.0000 [oz_av] | Freq: Four times a day (QID) | ORAL | Status: DC

## 2014-02-27 MED ORDER — KCL IN DEXTROSE-NACL 20-5-0.45 MEQ/L-%-% IV SOLN
INTRAVENOUS | Status: AC
  Filled 2014-02-27: qty 1000

## 2014-02-27 MED ORDER — ACETAMINOPHEN 10 MG/ML IV SOLN
1000.0000 mg | Freq: Once | INTRAVENOUS | Status: AC
  Administered 2014-02-27: 1000 mg via INTRAVENOUS
  Filled 2014-02-27: qty 100

## 2014-02-27 MED ORDER — HYDRALAZINE HCL 20 MG/ML IJ SOLN
INTRAMUSCULAR | Status: AC
  Filled 2014-02-27: qty 1

## 2014-02-27 MED ORDER — HYDROMORPHONE HCL PF 1 MG/ML IJ SOLN
0.2500 mg | INTRAMUSCULAR | Status: DC | PRN
  Administered 2014-02-27 (×4): 0.5 mg via INTRAVENOUS

## 2014-02-27 MED ORDER — PANTOPRAZOLE SODIUM 40 MG IV SOLR
40.0000 mg | Freq: Every day | INTRAVENOUS | Status: DC
  Administered 2014-02-27 – 2014-03-01 (×3): 40 mg via INTRAVENOUS
  Filled 2014-02-27 (×3): qty 40

## 2014-02-27 MED ORDER — ROCURONIUM BROMIDE 100 MG/10ML IV SOLN
INTRAVENOUS | Status: AC
  Filled 2014-02-27: qty 1

## 2014-02-27 MED ORDER — HYDRALAZINE HCL 20 MG/ML IJ SOLN
5.0000 mg | Freq: Once | INTRAMUSCULAR | Status: AC
  Administered 2014-02-27: 5 mg via INTRAVENOUS

## 2014-02-27 MED ORDER — PROPOFOL 10 MG/ML IV BOLUS
INTRAVENOUS | Status: AC
  Filled 2014-02-27: qty 20

## 2014-02-27 MED ORDER — HYDROMORPHONE HCL PF 1 MG/ML IJ SOLN
INTRAMUSCULAR | Status: DC | PRN
  Administered 2014-02-27 (×4): 0.5 mg via INTRAVENOUS

## 2014-02-27 MED ORDER — ONDANSETRON HCL 4 MG/2ML IJ SOLN: 4.0000 mg | INTRAMUSCULAR | Status: DC | PRN

## 2014-02-27 MED ORDER — NEOSTIGMINE METHYLSULFATE 1 MG/ML IJ SOLN
INTRAMUSCULAR | Status: DC | PRN
  Administered 2014-02-27: 5 mg via INTRAVENOUS

## 2014-02-27 MED ORDER — PROPOFOL 10 MG/ML IV BOLUS
INTRAVENOUS | Status: DC | PRN
  Administered 2014-02-27: 200 mg via INTRAVENOUS

## 2014-02-27 MED ORDER — ACETAMINOPHEN 160 MG/5ML PO SOLN
650.0000 mg | ORAL | Status: DC | PRN
  Filled 2014-02-27: qty 20.3

## 2014-02-27 MED ORDER — PROMETHAZINE HCL 25 MG/ML IJ SOLN: 6.2500 mg | INTRAMUSCULAR | Status: DC | PRN

## 2014-02-27 MED ORDER — HYDROMORPHONE HCL PF 2 MG/ML IJ SOLN
INTRAMUSCULAR | Status: AC
  Filled 2014-02-27: qty 1

## 2014-02-27 MED ORDER — CISATRACURIUM BESYLATE 20 MG/10ML IV SOLN
INTRAVENOUS | Status: AC
  Filled 2014-02-27: qty 10

## 2014-02-27 MED ORDER — UNJURY CHICKEN SOUP POWDER
2.0000 [oz_av] | Freq: Four times a day (QID) | ORAL | Status: DC
  Administered 2014-03-01: 2 [oz_av] via ORAL

## 2014-02-27 MED ORDER — LACTATED RINGERS IR SOLN
Status: DC | PRN
  Administered 2014-02-27: 3000 mL

## 2014-02-27 MED ORDER — ENOXAPARIN SODIUM 40 MG/0.4ML ~~LOC~~ SOLN
40.0000 mg | Freq: Two times a day (BID) | SUBCUTANEOUS | Status: DC
  Administered 2014-02-28 – 2014-03-01 (×3): 40 mg via SUBCUTANEOUS
  Filled 2014-02-27 (×5): qty 0.4

## 2014-02-27 MED ORDER — LIDOCAINE HCL (CARDIAC) 20 MG/ML IV SOLN
INTRAVENOUS | Status: AC
  Filled 2014-02-27: qty 5

## 2014-02-27 MED ORDER — ROCURONIUM BROMIDE 100 MG/10ML IV SOLN
INTRAVENOUS | Status: DC | PRN
  Administered 2014-02-27: 50 mg via INTRAVENOUS

## 2014-02-27 MED ORDER — ONDANSETRON HCL 4 MG/2ML IJ SOLN
INTRAMUSCULAR | Status: AC
  Filled 2014-02-27: qty 2

## 2014-02-27 MED ORDER — SODIUM CHLORIDE 0.9 % IJ SOLN
INTRAMUSCULAR | Status: AC
  Filled 2014-02-27: qty 20

## 2014-02-27 MED ORDER — MIDAZOLAM HCL 5 MG/5ML IJ SOLN
INTRAMUSCULAR | Status: DC | PRN
  Administered 2014-02-27: 2 mg via INTRAVENOUS

## 2014-02-27 MED ORDER — LIDOCAINE HCL (CARDIAC) 20 MG/ML IV SOLN
INTRAVENOUS | Status: DC | PRN
  Administered 2014-02-27: 50 mg via INTRAVENOUS

## 2014-02-27 MED ORDER — BUPIVACAINE-EPINEPHRINE 0.25% -1:200000 IJ SOLN
INTRAMUSCULAR | Status: AC
  Filled 2014-02-27: qty 1

## 2014-02-27 SURGICAL SUPPLY — 77 items
ADH SKN CLS APL DERMABOND .7 (GAUZE/BANDAGES/DRESSINGS) ×1
APL SRG 32X5 SNPLK LF DISP (MISCELLANEOUS) ×1
APPLICATOR COTTON TIP 6IN STRL (MISCELLANEOUS) ×2 IMPLANT
APPLIER CLIP ROT 13.4 12 LRG (CLIP) ×3
APR CLP LRG 13.4X12 ROT 20 MLT (CLIP) ×1
BAG URO CATCHER STRL LF (DRAPE) ×2 IMPLANT
BLADE SURG 15 STRL LF DISP TIS (BLADE) IMPLANT
BLADE SURG 15 STRL SS (BLADE) ×3
BLADE SURG SZ11 CARB STEEL (BLADE) ×3 IMPLANT
CABLE HIGH FREQUENCY MONO STRZ (ELECTRODE) IMPLANT
CHLORAPREP W/TINT 26ML (MISCELLANEOUS) ×6 IMPLANT
CLIP APPLIE ROT 13.4 12 LRG (CLIP) IMPLANT
CLIP SUT LAPRA TY ABSORB (SUTURE) ×6 IMPLANT
CUTTER LINEAR ENDO ART 45 ETS (STAPLE) ×3 IMPLANT
DERMABOND ADVANCED (GAUZE/BANDAGES/DRESSINGS) ×2
DERMABOND ADVANCED .7 DNX12 (GAUZE/BANDAGES/DRESSINGS) IMPLANT
DEVICE SUTURE ENDOST 10MM (ENDOMECHANICALS) ×3 IMPLANT
DRAIN PENROSE 18X1/4 LTX STRL (WOUND CARE) ×1 IMPLANT
DRAIN PENROSE LF 8X20.3CM SIL (WOUND CARE) ×2 IMPLANT
DRAPE CAMERA CLOSED 9X96 (DRAPES) ×3 IMPLANT
DUPLOJECT EASY PREP 4ML (MISCELLANEOUS) ×2 IMPLANT
ELECT REM PT RETURN 9FT ADLT (ELECTROSURGICAL) ×3
ELECTRODE REM PT RTRN 9FT ADLT (ELECTROSURGICAL) ×1 IMPLANT
GAUZE SPONGE 4X4 16PLY XRAY LF (GAUZE/BANDAGES/DRESSINGS) ×3 IMPLANT
GLOVE BIOGEL M STRL SZ7.5 (GLOVE) IMPLANT
GLOVE BIOGEL PI IND STRL 7.0 (GLOVE) IMPLANT
GLOVE BIOGEL PI IND STRL 7.5 (GLOVE) IMPLANT
GLOVE BIOGEL PI INDICATOR 7.0 (GLOVE) ×4
GLOVE BIOGEL PI INDICATOR 7.5 (GLOVE) ×4
GLOVE SURG SS PI 7.0 STRL IVOR (GLOVE) ×4 IMPLANT
GLOVE SURG SS PI 7.5 STRL IVOR (GLOVE) ×4 IMPLANT
GOWN STRL REUS W/TWL XL LVL3 (GOWN DISPOSABLE) ×18 IMPLANT
HOVERMATT SINGLE USE (MISCELLANEOUS) ×3 IMPLANT
KIT BASIN OR (CUSTOM PROCEDURE TRAY) ×3 IMPLANT
KIT GASTRIC LAVAGE 34FR ADT (SET/KITS/TRAYS/PACK) ×3 IMPLANT
MARKER SKIN DUAL TIP RULER LAB (MISCELLANEOUS) ×3 IMPLANT
NDL SPNL 22GX3.5 QUINCKE BK (NEEDLE) ×1 IMPLANT
NEEDLE SPNL 22GX3.5 QUINCKE BK (NEEDLE) ×3 IMPLANT
PACK CARDIOVASCULAR III (CUSTOM PROCEDURE TRAY) ×3 IMPLANT
RELOAD 45 VASCULAR/THIN (ENDOMECHANICALS) ×3 IMPLANT
RELOAD BLUE (STAPLE) ×10 IMPLANT
RELOAD ENDO STITCH 2.0 (ENDOMECHANICALS) ×27
RELOAD GOLD (STAPLE) ×3 IMPLANT
RELOAD STAPLE 45 2.5 WHT GRN (ENDOMECHANICALS) ×1 IMPLANT
RELOAD STAPLE 45 3.5 BLU ETS (ENDOMECHANICALS) ×1 IMPLANT
RELOAD STAPLE TA45 3.5 REG BLU (ENDOMECHANICALS) ×6 IMPLANT
RELOAD SUT SNGL STCH ABSRB 2-0 (ENDOMECHANICALS) ×4 IMPLANT
RELOAD SUT SNGL STCH BLK 2-0 (ENDOMECHANICALS) ×4 IMPLANT
RELOAD WHITE ECR60W (STAPLE) ×3 IMPLANT
SCISSORS LAP 5X35 DISP (ENDOMECHANICALS) ×3 IMPLANT
SEALANT SURGICAL APPL DUAL CAN (MISCELLANEOUS) ×3 IMPLANT
SET IRRIG TUBING LAPAROSCOPIC (IRRIGATION / IRRIGATOR) ×3 IMPLANT
SHEARS HARMONIC ACE PLUS 45CM (MISCELLANEOUS) ×3 IMPLANT
SLEEVE ADV FIXATION 12X100MM (TROCAR) ×6 IMPLANT
SOLUTION ANTI FOG 6CC (MISCELLANEOUS) ×3 IMPLANT
SPONGE GAUZE 4X4 12PLY (GAUZE/BANDAGES/DRESSINGS) IMPLANT
STAPLE ECHEON FLEX 60 POW ENDO (STAPLE) ×3 IMPLANT
STAPLER VISISTAT 35W (STAPLE) IMPLANT
SUT DVC SILK 2.0X39 (SUTURE) ×2 IMPLANT
SUT DVC VICRYL PGA 2.0X39 (SUTURE) ×2 IMPLANT
SUT MNCRL AB 4-0 PS2 18 (SUTURE) ×3 IMPLANT
SUT RELOAD ENDO STITCH 2 48X1 (ENDOMECHANICALS) ×6
SUT RELOAD ENDO STITCH 2.0 (ENDOMECHANICALS) ×3
SUT VIC AB 2-0 SH 27 (SUTURE) ×3
SUT VIC AB 2-0 SH 27X BRD (SUTURE) ×1 IMPLANT
SUTURE RELOAD END STTCH 2 48X1 (ENDOMECHANICALS) ×6 IMPLANT
SUTURE RELOAD ENDO STITCH 2.0 (ENDOMECHANICALS) ×3 IMPLANT
SYR 20CC LL (SYRINGE) ×6 IMPLANT
TOWEL OR 17X26 10 PK STRL BLUE (TOWEL DISPOSABLE) ×3 IMPLANT
TOWEL OR NON WOVEN STRL DISP B (DISPOSABLE) ×3 IMPLANT
TRAY FOLEY CATH 14FRSI W/METER (CATHETERS) ×1 IMPLANT
TROCAR ADV FIXATION 12X100MM (TROCAR) ×3 IMPLANT
TROCAR ADV FIXATION 5X100MM (TROCAR) ×3 IMPLANT
TROCAR BLADELESS OPT 5 100 (ENDOMECHANICALS) ×3 IMPLANT
TROCAR XCEL 12X100 BLDLESS (ENDOMECHANICALS) ×3 IMPLANT
TUBING ENDO SMARTCAP PENTAX (MISCELLANEOUS) ×3 IMPLANT
TUBING FILTER THERMOFLATOR (ELECTROSURGICAL) ×3 IMPLANT

## 2014-02-27 NOTE — Op Note (Signed)
[name] [id number] [birth year]. 02/27/2014  Preoperative diagnosis:  Morbid obesity 44.67  Fatty liver  Hiatal hernia  HTN  OSA on CPAP  Low back pain  migraine   Postoperative  diagnosis:  1. same  Surgical procedure: Laparoscopic Roux-en-Y gastric bypass (ante-colic, ante-gastric); upper endoscopy  Surgeon: [name], M.D. FACS  Asst.: Alphonsa Overall, MD FACS  Anesthesia: General plus 0.25% marcaine with epi  Complications: None   EBL: Minimal   Drains: None   Disposition: PACU in good condition   Indications for procedure: 43yo AAF with morbid obesity who has been unsuccessful at sustained weight loss. The patient's comorbidities are listed above. We discussed the risk and benefits of surgery including but not limited to anesthesia risk, bleeding, infection, blood clot formation, anastomotic leak, anastomotic stricture, ulcer formation, death, respiratory complications, intestinal blockage, internal hernia, gallstone formation, vitamin and nutritional deficiencies, injury to surrounding structures, failure to lose weight and mood changes.   Description of procedure: Patient is brought to the operating room and general anesthesia induced. The patient had received preoperative broad-spectrum IV antibiotics and subcutaneous heparin. The abdomen was widely sterilely prepped with Chloraprep and draped. Patient timeout was performed and correct patient and procedure confirmed. Access was obtained with a 12 mm Optiview trocar in the left upper quadrant and pneumoperitoneum established without difficulty. Under direct vision 12 mm trocars were placed laterally in the right upper quadrant, right upper quadrant midclavicular line, and to the left and above the umbilicus for the camera port. A 5 mm trocar was placed laterally in the left upper quadrant. There was a small omental adhesion around the midline which was taken down with endoshears.  The omentum was brought into the upper  abdomen and the transverse mesocolon elevated and the ligament of Treitz clearly identified. A 40 cm biliopancreatic limb was then carefully measured from the ligament of Treitz. The small intestine was divided at this point with a single firing of the white load linear stapler. A Penrose drain was sutured to the end of the Roux-en-Y limb for later identification. A 100 cm Roux-en-Y limb was then carefully measured. At this point a side-to-side anastomosis was created between the Roux limb and the end of the biliopancreatic limb. This was accomplished with a single firing of the 45 mm white load linear stapler. The common enterotomy was closed with a running 2-0 Vicryl begun at either end of the enterotomy and tied centrally. Tisseel tissue sealant was placed over the anastomosis. The mesenteric defect was then closed with running 2-0 silk. The omentum was then divided with the harmonic scalpel up towards the transverse colon to allow mobility of the Roux limb toward the gastric pouch. The patient was then placed in steep reversed Trendelenburg. Through a 5 mm subxiphoid site the [employer] retractor was placed and the left lobe of the liver elevated with excellent exposure of the upper stomach and hiatus. A calibration tube was passed by the CRNA down into the stomach and inflated with 10cc of air. The tube was then slowly pulled back and there was adequate resistance at the GE junction so we did not dissect out the crura and do a hiatal hernia repair since it did not appear to be significant. The angle of Hiss was then mobilized with the harmonic scalpel. A 4-5 cm gastric pouch was then carefully measured along the lesser curve of the stomach. Dissection was carried along the lesser curve at this point with the Harmonic scalpel working carefully back toward  the lesser sac at right angles to the lesser curve. The free lesser sac was then entered. After being sure all tubes were removed from the stomach an initial  firing of the gold load 60 mm linear stapler was fired at right angles across the lesser curve for about 4 cm. The gastric pouch was further mobilized posteriorly and then the pouch was completed with 2 further firings of the 60 mm blue load linear stapler and 1 final firing of a blue load 30m linear stapler up through the previously dissected angle of His. It was ensured that the pouch was completely mobilized away from the gastric remnant. This created a nice tubular 4-5 cm gastric pouch. The Roux limb was then brought up in an antecolic fashion with the candycane facing to the patient's left without undue tension. The gastrojejunostomy was created with an initial posterior row of 2-0 Vicryl between the Roux limb and the staple line of the gastric pouch. Enterotomies were then made in the gastric pouch and the Roux limb with the harmonic scalpel and at approximately 2-2-1/2 cm anastomosis was created with a single firing of the 42mblue load linear stapler. The staple line was inspected and was intact without bleeding. The common enterotomy was then closed with running 2-0 Vicryl begun at either end and tied centrally. The Ewall tube was then easily passed through the anastomosis and an outer anterior layer of running 2-0 Vicryl was placed. The Ewald tube was removed. With the outlet of the gastrojejunostomy clamped and under saline irrigation the assistant performed upper endoscopy and with the gastric pouch tensely distended with air-there was no evidence of leak on this test. The pouch was desufflated. The PeTerance Hartefect was closed with running 2-0 silk. The abdomen was inspected for any evidence of bleeding or bowel injury and everything looked fine. The Nathanson retractor was removed under direct vision after coating the anastomosis with Tisseel tissue sealant. All CO2 was evacuated and trochars removed. Skin incisions were closed with 4-0 monocryl in a subcuticular fashion followed by Dermabond. Sponge  needle and instrument counts were correct. The patient was taken to the PACU in good condition.    ErLeighton RuffWiRedmond PullingMD, FACS General, Bariatric, & Minimally Invasive Surgery [facility], [place]

## 2014-02-27 NOTE — Transfer of Care (Signed)
Immediate Anesthesia Transfer of Care Note  Patient: [name]  Procedure(s) Performed: Procedure(s): LAPAROSCOPIC ROUX-EN-Y GASTRIC BYPASS WITH UPPER ENDOSCOPY  (N/A)  Patient Location: PACU  Anesthesia Type:General  Level of Consciousness: awake, alert , oriented and patient cooperative  Airway & Oxygen Therapy: Patient Spontanous Breathing and Patient connected to face mask oxygen  Post-op Assessment: Report given to PACU RN, Post -op Vital signs reviewed and stable and Patient moving all extremities  Post vital signs: Reviewed and stable  Complications: No apparent anesthesia complications

## 2014-02-27 NOTE — Progress Notes (Signed)
Patient unable to void.  Patient I/O cathed per MD order for 250cc amber urine, without difficulty.  Plan of care discussed with patient and patient verbalizes understanding.  Call light within reach

## 2014-02-27 NOTE — Op Note (Signed)
Name:  [name] MRN: [id number] Date of Surgery: 02/27/2014  Preop Diagnosis:  Morbid Obesity, S/P RYGB  Postop Diagnosis:  Morbid Obesity (Weight - 248, BMI - 44.6), S/P RYGB  Procedure:  Upper endoscopy  (Intraoperative)  Surgeon:  Alphonsa Overall, M.D.  Anesthesia:  GET  Indications for procedure: [name] is a 43 y.o. female whose primary care physician is Lynne Logan, MD and has completed a Roux-en-Y gastric bypass today by Dr. Redmond Pulling.  I am doing an intraoperative upper endoscopy to evaluate the gastric pouch and the gastro-jejunal anastomosis.  Operative Note: The patient is under general anesthesia.  Dr. Redmond Pulling is laparoscoping the patient while I do an upper endoscopy to evaluate the stomach pouch and gastrojejunal anastomosis.  With the patient intubated, I passed the Pentax endoscope without difficulty down the esophagus.  The esophago-gastric junction was at 36 cm.  The gastro-jejunal anastomosis was at 41 cm.  The mucosa of the stomach looked viable and the staple line was intact without bleeding.  The gastro-jejunal anastomosis looked okay.  While I insufflated the stomach pouch with air, Dr. Redmond Pulling clamped off the efferent limb of the jejunum.  He then flooded the upper abdomen with saline to put the gastric pouch and gastro-jejunal anastomosis under saline.  There was no bubbling or evidence of a leak.  Photos were taken of the anastomosis.  The scope was then withdrawn.  The esophagus was unremarkable and the patient tolerated the endoscopy without difficulty.  Alphonsa Overall, MD, [facility] Surgery Pager: [phone] Office phone:  [phone]

## 2014-02-27 NOTE — H&P (View-Only) (Signed)
Patient ID: [name], female   DOB: 1971-07-01, 43 y.o.   MRN: [id number]  Chief complaint: Here to talk about surgery  HPI [name] is a 43 y.o. female.   HPI 43 year old morbidly obese African American female comes in today for her preop appointment. She is currently scheduled for laparoscopic Roux-en-Y gastric bypass with possible hiatal hernia repair on March 2. I last saw her in the office in February 2014. She had to undergo physician supervised weight loss. Otherwise she denies any significant changes to her medical history since she was last seen. She did have some right foot pain when she puts pressure on it several months ago however that has resolved. She is still using CPAP. Past Medical History  Diagnosis Date  . Hypertension   . OSA on CPAP   . Morbid obesity     Past Surgical History  Procedure Laterality Date  . Cesarean section  05/2005  . Tubal ligation  2011    Family History  Problem Relation Age of Onset  . Cancer Mother     lung  . Emphysema Father   . Heart failure Father   . Cancer Maternal Aunt     breast    Social History History  Substance Use Topics  . Smoking status: Never Smoker   . Smokeless tobacco: Never Used  . Alcohol Use: Yes     Comment: occasional glass of wine or mixed drink 2 or 3 times a month    Allergies  Allergen Reactions  . Lasix [Furosemide] Rash  . Lisinopril Hives, Itching and Swelling    Current Outpatient Prescriptions  Medication Sig Dispense Refill  . acetaminophen (TYLENOL) 500 MG tablet Take 1,000 mg by mouth every 6 (six) hours as needed for mild pain or moderate pain.      Marland Kitchen atenolol (TENORMIN) 50 MG tablet Take 50 mg by mouth every morning.       . hydrochlorothiazide (HYDRODIURIL) 25 MG tablet Take 25 mg by mouth every morning.       Marland Kitchen oxyCODONE (ROXICODONE) 5 MG/5ML solution Take 5-10 mLs (5-10 mg total) by mouth every 4 (four) hours as needed for severe pain. After surgery  200 mL  0   No  current facility-administered medications for this visit.    Review of Systems Review of Systems  Constitutional: Negative for fever, activity change, appetite change and unexpected weight change.  HENT: Negative for nosebleeds and trouble swallowing.   Eyes: Negative for photophobia and visual disturbance.  Respiratory: Negative for chest tightness and shortness of breath.        Uses CPAP  Cardiovascular: Negative for chest pain and leg swelling.       Denies CP, SOB, orthopnea, PND, DOE  Gastrointestinal: Negative for nausea, vomiting, abdominal pain and diarrhea.       Denies reflux  Genitourinary: Negative for dysuria and difficulty urinating.  Musculoskeletal: Negative for arthralgias.  Skin: Negative for pallor and rash.  Neurological: Positive for headaches. Negative for dizziness, seizures, facial asymmetry and numbness.       Denies TIA and amaurosis fugax   Hematological: Negative for adenopathy. Does not bruise/bleed easily.  Psychiatric/Behavioral: Negative for behavioral problems and agitation.    Blood pressure 128/84, pulse 64, temperature 98.2 F (36.8 C), resp. rate 16, height 5' 2.5" (1.588 m), weight 248 lb 3.2 oz (112.583 kg).  Physical Exam Physical Exam  Vitals reviewed. Constitutional: She is oriented to person, place, and time. She appears well-developed and  well-nourished. No distress.  HENT:  Head: Normocephalic and atraumatic.  Right Ear: External ear normal.  Left Ear: External ear normal.  Eyes: Conjunctivae are normal. No scleral icterus.  Neck: Normal range of motion. Neck supple. No tracheal deviation present. No thyromegaly present.  Cardiovascular: Normal rate, normal heart sounds and intact distal pulses.   Pulmonary/Chest: Effort normal and breath sounds normal. No respiratory distress. She has no wheezes.  Abdominal: Soft. She exhibits no distension. There is no tenderness. There is no rebound.    Musculoskeletal: Normal range of motion.  She exhibits no edema and no tenderness.  Lymphadenopathy:    She has no cervical adenopathy.  Neurological: She is alert and oriented to person, place, and time. She exhibits normal muscle tone.  Skin: Skin is warm and dry. No rash noted. She is not diaphoretic. No erythema. No pallor.  Psychiatric: She has a normal mood and affect. Her behavior is normal. Judgment and thought content normal.    Data Reviewed UGI - small hiatal hernia abd u/s - fatty liver Initial visit labs - essentially normal - LDL 105  Assessment    Morbid obesity 44.67 Fatty liver Hiatal hernia HTN OSA on CPAP Low back pain migraine     Plan    We reviewed her bariatric surgery workup and discuss her labs as well as imaging results. We discussed the finding of a small hiatal hernia. I explained that we would test her for a hiatal hernia during surgery and this found a significant hernia we would proceed with interoperative repair by bringing the diaphragm muscles back together with sutures. All of her questions were asked and answered. She was given her postoperative pain medicine prescription of oxycodone elixir today so she could go ahead and have that filled. We discussed the importance of the preoperative diet. I congratulated her on her weight loss. She was encouraged to contact me if she has any questions between now and surgery  [name]. Redmond Pulling, MD, FACS General, Bariatric, & Minimally Invasive Surgery [facility] Surgery, [place]        [facility] M 02/21/2014, 4:39 PM

## 2014-02-27 NOTE — Interval H&P Note (Signed)
History and Physical Interval Note:  02/27/2014 7:11 AM  [name]  has presented today for surgery, with the diagnosis of MORBID OBESITY   The various methods of treatment have been discussed with the patient and family. After consideration of risks, benefits and other options for treatment, the patient has consented to  Procedure(s): LAPAROSCOPIC ROUX-EN-Y GASTRIC BYPASS WITH UPPER ENDOSCOPY possible Hiatal hernia  (N/A) as a surgical intervention .  The patient's history has been reviewed, patient examined, no change in status, stable for surgery.  I have reviewed the patient's chart and labs.  Questions were answered to the patient's satisfaction.    [name]. Hayley Pulling, MD, [place], Bariatric, & Minimally Invasive Surgery [facility] Surgery, [place]   [facility] M

## 2014-02-27 NOTE — Anesthesia Postprocedure Evaluation (Signed)
  Anesthesia Post-op Note  Patient: [name]  Procedure(s) Performed: Procedure(s) (LRB): LAPAROSCOPIC ROUX-EN-Y GASTRIC BYPASS WITH UPPER ENDOSCOPY  (N/A)  Patient Location: PACU  Anesthesia Type: General  Level of Consciousness: awake and alert   Airway and Oxygen Therapy: Patient Spontanous Breathing  Post-op Pain: mild  Post-op Assessment: Post-op Vital signs reviewed, Patient's Cardiovascular Status Stable, Respiratory Function Stable, Patent Airway and No signs of Nausea or vomiting  Last Vitals:  Filed Vitals:   02/27/14 1215  BP: 138/81  Pulse: 72  Temp:   Resp: 11    Post-op Vital Signs: stable   Complications: No apparent anesthesia complications

## 2014-02-28 ENCOUNTER — Encounter (HOSPITAL_COMMUNITY): Payer: Self-pay | Admitting: General Surgery

## 2014-02-28 ENCOUNTER — Encounter (INDEPENDENT_AMBULATORY_CARE_PROVIDER_SITE_OTHER): Payer: Self-pay | Admitting: General Surgery

## 2014-02-28 ENCOUNTER — Inpatient Hospital Stay (HOSPITAL_COMMUNITY): Payer: BC Managed Care – PPO

## 2014-02-28 LAB — COMPREHENSIVE METABOLIC PANEL
ALBUMIN: 3.3 g/dL — AB (ref 3.5–5.2)
ALT: 38 U/L — ABNORMAL HIGH (ref 0–35)
AST: 36 U/L (ref 0–37)
Alkaline Phosphatase: 58 U/L (ref 39–117)
BUN: 8 mg/dL (ref 6–23)
CALCIUM: 9.4 mg/dL (ref 8.4–10.5)
CO2: 22 mEq/L (ref 19–32)
Chloride: 103 mEq/L (ref 96–112)
Creatinine, Ser: 0.74 mg/dL (ref 0.50–1.10)
GFR calc Af Amer: >90 mL/min (ref >90)
GFR calc non Af Amer: >90 mL/min (ref >90)
Glucose, Bld: 119 mg/dL — ABNORMAL HIGH (ref 70–99)
Potassium: 3.9 mEq/L (ref 3.7–5.3)
Sodium: 138 mEq/L (ref 137–147)
Total Bilirubin: <0.2 mg/dL — ABNORMAL LOW (ref 0.3–1.2)
Total Protein: 6.8 g/dL (ref 6.0–8.3)

## 2014-02-28 LAB — CBC WITH DIFFERENTIAL/PLATELET
BASOS ABS: 0 10*3/uL (ref 0.0–0.1)
BASOS PCT: 0 % (ref 0–1)
Eosinophils Absolute: 0 10*3/uL (ref 0.0–0.7)
Eosinophils Relative: 0 % (ref 0–5)
HEMATOCRIT: 32.5 % — AB (ref 36.0–46.0)
HEMOGLOBIN: 11.3 g/dL — AB (ref 12.0–15.0)
Lymphocytes Relative: 10 % — ABNORMAL LOW (ref 12–46)
Lymphs Abs: 1.5 10*3/uL (ref 0.7–4.0)
MCH: 28.2 pg (ref 26.0–34.0)
MCHC: 34.8 g/dL (ref 30.0–36.0)
MCV: 81 fL (ref 78.0–100.0)
Monocytes Absolute: 1 10*3/uL (ref 0.1–1.0)
Monocytes Relative: 7 % (ref 3–12)
NEUTROS ABS: 11.9 10*3/uL — AB (ref 1.7–7.7)
Neutrophils Relative %: 83 % — ABNORMAL HIGH (ref 43–77)
Platelets: 302 10*3/uL (ref 150–400)
RBC: 4.01 MIL/uL (ref 3.87–5.11)
RDW: 14 % (ref 11.5–15.5)
WBC: 14.4 10*3/uL — ABNORMAL HIGH (ref 4.0–10.5)

## 2014-02-28 LAB — HEMOGLOBIN AND HEMATOCRIT, BLOOD
HEMATOCRIT: 33.3 % — AB (ref 36.0–46.0)
Hemoglobin: 11.2 g/dL — ABNORMAL LOW (ref 12.0–15.0)

## 2014-02-28 MED ORDER — IOHEXOL 300 MG/ML  SOLN: 50.0000 mL | Freq: Once | INTRAMUSCULAR | Status: AC | PRN

## 2014-02-28 MED ORDER — HYDROCHLOROTHIAZIDE 25 MG PO TABS
25.0000 mg | ORAL_TABLET | Freq: Every morning | ORAL | Status: DC
  Filled 2014-02-28: qty 1

## 2014-02-28 MED ORDER — ACETAMINOPHEN 10 MG/ML IV SOLN
1000.0000 mg | Freq: Four times a day (QID) | INTRAVENOUS | Status: AC
  Administered 2014-02-28 – 2014-03-01 (×4): 1000 mg via INTRAVENOUS
  Filled 2014-02-28 (×4): qty 100

## 2014-02-28 MED ORDER — HYDROXYZINE HCL 10 MG/5ML PO SYRP: 25.0000 mg | ORAL_SOLUTION | Freq: Three times a day (TID) | ORAL | Status: DC | PRN

## 2014-02-28 NOTE — Progress Notes (Signed)
Pt seen and examined Walked. Tolerating water. Some pain but not bad. No n/v.   Alert, nad, talking on phone Soft, expected TTP  Vitals ok. Will let BP ride for now. hgb stable.   Adv to POD 2 diet now - shakes  [name]. Redmond Pulling, MD, FACS General, Bariatric, & Minimally Invasive Surgery [facility], [place]

## 2014-02-28 NOTE — Care Management Note (Signed)
    Page 1 of 1   02/28/2014     12:15:29 PM   CARE MANAGEMENT NOTE 02/28/2014  Patient:  [name], [name]   Account Number:  [id number]  Date Initiated:  02/28/2014  Documentation initiated by:  Sunday Spillers  Subjective/Objective Assessment:   43 yo female admitted s/p gastric bypass. PTA lived at home with spouse.     Action/Plan:   Home when stable   Anticipated DC Date:  03/02/2014   Anticipated DC Plan:  [place]  CM consult      Choice offered to / List presented to:             Status of service:  Completed, signed off Medicare Important Message given?   (If response is "NO", the following Medicare IM given date fields will be blank) Date Medicare IM given:   Date Additional Medicare IM given:    Discharge Disposition:  HOME/SELF CARE  Per UR Regulation:  Reviewed for med. necessity/level of care/duration of stay  If discussed at [place] of Stay Meetings, dates discussed:    Comments:

## 2014-02-28 NOTE — Progress Notes (Signed)
1 Day Post-Op  Subjective: Doing well. Had some nausea at end of walk. Pain about 5-6/10.   Objective: Vital signs in last 24 hours: Temp:  [97.3 F (36.3 C)-98.6 F (37 C)] 98.1 F (36.7 C) (03/03 0545) Pulse Rate:  [65-84] 74 (03/03 0545) Resp:  [7-20] 18 (03/03 0545) BP: (93-167)/(53-92) 93/53 mmHg (03/03 0545) SpO2:  [94 %-100 %] 100 % (03/03 0545) Last BM Date: 02/27/14  Intake/Output from previous day: 03/02 0701 - 03/03 0700 In: 3346.7 [I.V.:3346.7] Out: 800 [Urine:780; Blood:20] Intake/Output this shift:    Asleep, easily awakens cta Reg Soft, expected mild TTP, incisions c/d/i No edema. +SCDs  Lab Results:   Recent Labs  02/27/14 1102 02/28/14 0420  WBC  --  14.4*  HGB 12.7 11.3*  HCT 38.3 32.5*  PLT  --  302   BMET  Recent Labs  02/28/14 0420  NA 138  K 3.9  CL 103  CO2 22  GLUCOSE 119*  BUN 8  CREATININE 0.74  CALCIUM 9.4   PT/INR No results found for this basename: LABPROT, INR,  in the last 72 hours ABG No results found for this basename: PHART, PCO2, PO2, HCO3,  in the last 72 hours  Studies/Results: No results found.  Anti-infectives: Anti-infectives   Start     Dose/Rate Route Frequency Ordered Stop   02/27/14 0532  cefOXitin (MEFOXIN) 2 g in dextrose 5 % 50 mL IVPB     2 g 100 mL/hr over 30 Minutes Intravenous On call to O.R. 02/27/14 0532 02/27/14 0716      Assessment/Plan: Morbid obesity OSA on cpap htn  s/p Procedure(s): LAPAROSCOPIC ROUX-EN-Y GASTRIC BYPASS WITH UPPER ENDOSCOPY  (N/A)  No fever. Looks good. For UGI this am - if ok will start water/ice chips  VTE prophylaxis - cont lovenox, scds  Heme - hgb slightly down. Repeat h/h later today and in am.   CV - bp a little soft. Will monitor. When restart home BP meds may have to decrease dose.   [name]. Redmond Pulling, MD, FACS General, Bariatric, & Minimally Invasive Surgery [facility] Surgery, [place]   LOS: 1 day    [name] 02/28/2014

## 2014-02-28 NOTE — Progress Notes (Signed)
Nutrition Education Note  Received consult for diet education of bariatric diet progression per DROP initiative.   Diet: First 2 Weeks  You will see the nutritionist about two (2) weeks after your surgery. The nutritionist will increase the types of foods you can eat if you are handling liquids well:  If you have severe vomiting or nausea and cannot handle clear liquids lasting longer than 1 day, call your surgeon  Protein Shake  Drink at least 2 ounces of shake 5-6 times per day  Each serving of protein shakes (usually 8 - 12 ounces) should have a minimum of:  15 grams of protein  And no more than 5 grams of carbohydrate  Goal for protein each day:   Women = 60 grams per day  Protein powder may be added to fluids such as non-fat milk or Lactaid milk or Soy milk (limit to 35 grams added protein powder per serving)   Hydration  Slowly increase the amount of water and other clear liquids as tolerated (See Acceptable Fluids)  Slowly increase the amount of protein shake as tolerated  Sip fluids slowly and throughout the day  May use sugar substitutes in small amounts (no more than 6 - 8 packets per day; i.e. Splenda)   Fluid Goal  The first goal is to drink at least 8 ounces of protein shake/drink per day (or as directed by the nutritionist); some examples of protein shakes are [employer], [employer], EAS Edge HP, and Unjury. See handout from pre-op Bariatric Education Class:  Slowly increase the amount of protein shake you drink as tolerated  You may find it easier to slowly sip shakes throughout the day  It is important to get your proteins in first  Your fluid goal is to drink 64 - 100 ounces of fluid daily  It may take a few weeks to build up to this  32 oz (or more) should be clear liquids  And  32 oz (or more) should be full liquids (see below for examples)  Liquids should not contain sugar, caffeine, or carbonation   Clear Liquids:  Water or Sugar-free flavored water  (i.e. Fruit H2O, Propel)  Decaffeinated coffee or tea (sugar-free)  Crystal Lite, Wyler's Lite, Minute Maid Lite  Sugar-free Jell-O  Bouillon or broth  Sugar-free Popsicle: *Less than 20 calories each; Limit 1 per day   Full Liquids:  Protein Shakes/Drinks + 2 choices per day of other full liquids  Full liquids must be:  No More Than 12 grams of Carbs per serving  No More Than 3 grams of Fat per serving  Strained low-fat cream soup  Non-Fat milk  Fat-free Lactaid Milk  Sugar-free yogurt (Dannon Lite & Fit, Greek yogurt)   Reviewed pt's vitamin and mineral requirements.   Answered all of pt's questions and encouraged diet adherence and outpatient RD follow up.   Mikey College MS, [place], [place] Pager [phone] After Hours Pager

## 2014-03-01 ENCOUNTER — Telehealth (INDEPENDENT_AMBULATORY_CARE_PROVIDER_SITE_OTHER): Payer: Self-pay | Admitting: General Surgery

## 2014-03-01 DIAGNOSIS — M545 Low back pain, unspecified: Secondary | ICD-10-CM | POA: Insufficient documentation

## 2014-03-01 DIAGNOSIS — I10 Essential (primary) hypertension: Secondary | ICD-10-CM | POA: Diagnosis present

## 2014-03-01 DIAGNOSIS — Z9884 Bariatric surgery status: Secondary | ICD-10-CM

## 2014-03-01 DIAGNOSIS — G4733 Obstructive sleep apnea (adult) (pediatric): Secondary | ICD-10-CM | POA: Diagnosis present

## 2014-03-01 DIAGNOSIS — Z9989 Dependence on other enabling machines and devices: Secondary | ICD-10-CM

## 2014-03-01 LAB — CBC WITH DIFFERENTIAL/PLATELET
BASOS PCT: 0 % (ref 0–1)
Basophils Absolute: 0 10*3/uL (ref 0.0–0.1)
EOS ABS: 0.1 10*3/uL (ref 0.0–0.7)
EOS PCT: 1 % (ref 0–5)
HEMATOCRIT: 32.7 % — AB (ref 36.0–46.0)
Hemoglobin: 10.6 g/dL — ABNORMAL LOW (ref 12.0–15.0)
LYMPHS PCT: 41 % (ref 12–46)
Lymphs Abs: 4.4 10*3/uL — ABNORMAL HIGH (ref 0.7–4.0)
MCH: 26.8 pg (ref 26.0–34.0)
MCHC: 32.4 g/dL (ref 30.0–36.0)
MCV: 82.6 fL (ref 78.0–100.0)
MONOS PCT: 7 % (ref 3–12)
Monocytes Absolute: 0.8 10*3/uL (ref 0.1–1.0)
NEUTROS PCT: 51 % (ref 43–77)
Neutro Abs: 5.4 10*3/uL (ref 1.7–7.7)
Platelets: 284 10*3/uL (ref 150–400)
RBC: 3.96 MIL/uL (ref 3.87–5.11)
RDW: 14.5 % (ref 11.5–15.5)
WBC: 10.7 10*3/uL — ABNORMAL HIGH (ref 4.0–10.5)

## 2014-03-01 LAB — CBC
HCT: 35 % — ABNORMAL LOW (ref 36.0–46.0)
HEMOGLOBIN: 11.2 g/dL — AB (ref 12.0–15.0)
MCH: 26.4 pg (ref 26.0–34.0)
MCHC: 32 g/dL (ref 30.0–36.0)
MCV: 82.4 fL (ref 78.0–100.0)
PLATELETS: 275 10*3/uL (ref 150–400)
RBC: 4.25 MIL/uL (ref 3.87–5.11)
RDW: 14.5 % (ref 11.5–15.5)
WBC: 8.5 10*3/uL (ref 4.0–10.5)

## 2014-03-01 MED ORDER — HYDROCHLOROTHIAZIDE 25 MG PO TABS
12.5000 mg | ORAL_TABLET | Freq: Every morning | ORAL | Status: DC
  Filled 2014-03-01: qty 0.5

## 2014-03-01 MED ORDER — MAGNESIUM HYDROXIDE 400 MG/5ML PO SUSP
15.0000 mL | Freq: Once | ORAL | Status: AC
Start: 2014-03-01 — End: 2014-03-01
  Administered 2014-03-01: 15 mL via ORAL
  Filled 2014-03-01: qty 30

## 2014-03-01 MED ORDER — HYDROCHLOROTHIAZIDE 25 MG PO TABS: 12.5000 mg | ORAL_TABLET | Freq: Every morning | ORAL | Status: DC

## 2014-03-01 MED ORDER — ATENOLOL 50 MG PO TABS: 25.0000 mg | ORAL_TABLET | Freq: Every morning | ORAL | Status: DC

## 2014-03-01 MED ORDER — HYDROCHLOROTHIAZIDE 12.5 MG PO CAPS
12.5000 mg | ORAL_CAPSULE | Freq: Every day | ORAL | Status: DC
  Filled 2014-03-01 (×2): qty 1

## 2014-03-01 MED ORDER — PANTOPRAZOLE SODIUM 40 MG PO TBEC: 40.0000 mg | DELAYED_RELEASE_TABLET | Freq: Every day | ORAL | Status: DC

## 2014-03-01 NOTE — Discharge Summary (Signed)
Physician Discharge Summary  [name] IZT:[id number] DOB: [birth year] DOA: 02/27/2014  PCP: Lynne Logan, MD  Admit date: 02/27/2014 Discharge date: 03/01/2014  Recommendations for Outpatient Follow-up:  Lynne Logan, MD 1-2 weeks for blood pressure check   Follow-up Information   Follow up with [name], MD On 03/15/2014. (11:30 AM)    Specialty:  General Surgery   Contact information:   [address] [place] [address] [phone]      Discharge Diagnoses:  Active Problems:   Morbid obesity with BMI of 40.0-44.9, adult   s/p Lap Roux-en-Y gastric bypass 02/27/14   OSA on CPAP   HTN (hypertension)   Surgical Procedure: Laparoscopic Roux-en-Y gastric bypass, upper endoscopy  Discharge Condition: Good Disposition: Home  Diet recommendation: Postoperative gastric bypass diet  Filed Weights   02/27/14 0519  Weight: 241 lb 4 oz (109.43 kg)     Hospital Course:  The patient was admitted for a planned laparoscopic Roux-en-Y gastric bypass. Please see operative note. Preoperatively the patient was given 5000 units of subcutaneous heparin for DVT prophylaxis. Postoperative prophylactic Lovenox dosing was started on the morning of postoperative day 1. The patient underwent an upper GI on postoperative day 1 which demonstrated no extravasation of contrast and emptying of the contrast into the Roux limb. The patient was started on ice chips and water which they tolerated. On postoperative day 2 The patient's diet was advanced to protein shakes which they also tolerated. The patient was ambulating without difficulty. Their vital signs are stable without fever or tachycardia. Their hemoglobin had remained stable. The patient was maintained on their home settings for CPAP therapy. The patient had received discharge instructions and counseling. They were deemed stable for discharge. Her blood pressure was a little bit soft and vaired. Therefore I discussed with her  sending her home on only half her normal dosage of atenolol and HCTZ since her heart rate was in the 50s and her blood pressure was in the low to mid 100s. On the morning of discharge she did have a old bloody stool. We repeated a hemoglobin prior to discharge and her hemoglobin was stable. Her orthostatics were also checked and she did not have any evidence of orthostatic hypotension so I felt she was stable for discharge   Discharge Instructions  Discharge Orders   Future Appointments Provider Department Dept Phone   03/14/2014 3:30 PM [facility] [phone]   03/15/2014 11:30 AM [name], MD [facility], [place] [phone]   Future Orders Complete By Expires   Increase activity slowly  As directed        Medication List         acetaminophen 500 MG tablet  Commonly known as:  TYLENOL  Take 1,000 mg by mouth every 6 (six) hours as needed for mild pain or moderate pain.     atenolol 50 MG tablet  Commonly known as:  TENORMIN  Take 0.5 tablets (25 mg total) by mouth every morning.     hydrochlorothiazide 25 MG tablet  Commonly known as:  HYDRODIURIL  Take 0.5 tablets (12.5 mg total) by mouth every morning.     oxyCODONE 5 MG/5ML solution  Commonly known as:  ROXICODONE  Take 5-10 mLs (5-10 mg total) by mouth every 4 (four) hours as needed for severe pain. After surgery     pantoprazole 40 MG tablet  Commonly known as:  PROTONIX  Take 1 tablet (  40 mg total) by mouth daily.           Follow-up Information   Follow up with [name], MD On 03/15/2014. (11:30 AM)    Specialty:  General Surgery   Contact information:   [address] [place] [address] [phone]        The results of significant diagnostics from this hospitalization (including imaging, microbiology, ancillary and laboratory) are listed below for reference.    Significant Diagnostic Studies: Dg Chest 2  View  02/22/2014   CLINICAL DATA:  Gastric bypass.  EXAM: CHEST  2 VIEW  COMPARISON:  DG CHEST 2 VIEW dated 02/16/2013  FINDINGS: The heart size and mediastinal contours are within normal limits. Both lungs are clear. The visualized skeletal structures are unremarkable.  IMPRESSION: No active cardiopulmonary disease.   Electronically Signed   By: Marcello Moores  Register   On: 02/22/2014 10:44   Dg Duanne Limerick W/water Sol Cm  02/28/2014   CLINICAL DATA:  Status post Roux-en-Y gastric bypass.  EXAM: WATER SOLUBLE UPPER GI SERIES  TECHNIQUE: Single-column upper GI series was performed using water soluble contrast.  CONTRAST:  45 mL Omnipaque 300  COMPARISON:  None.  FLUOROSCOPY TIME:  1 min 12 seconds  FINDINGS: Scout abdominal radiograph demonstrates suture material in the left upper quadrant. Gas is present in nondilated loops of small and large bowel.  The patient drank contrast without difficulty. Contrast passed readily from the esophagus into the gastric pouch. The gastric pouch emptied readily into small bowel without evidence of obstruction. No contrast extravasation was identified to suggest a leak.  IMPRESSION: Unremarkable appearance of gastric bypass.  No evidence of leak.   Electronically Signed   By: Logan Bores   On: 02/28/2014 10:17    Labs: Basic Metabolic Panel:  Recent Labs Lab 02/28/14 0420  NA 138  K 3.9  CL 103  CO2 22  GLUCOSE 119*  BUN 8  CREATININE 0.74  CALCIUM 9.4   Liver Function Tests:  Recent Labs Lab 02/28/14 0420  AST 36  ALT 38*  ALKPHOS 58  BILITOT <0.2*  PROT 6.8  ALBUMIN 3.3*    CBC:  Recent Labs Lab 02/27/14 1102 02/28/14 0420 02/28/14 1605 03/01/14 0458 03/01/14 1046  WBC  --  14.4*  --  10.7* 8.5  NEUTROABS  --  11.9*  --  5.4  --   HGB 12.7 11.3* 11.2* 10.6* 11.2*  HCT 38.3 32.5* 33.3* 32.7* 35.0*  MCV  --  81.0  --  82.6 82.4  PLT  --  302  --  284 275    CBG: No results found for this basename: GLUCAP,  in the last 168 hours  Active  Problems:   Morbid obesity with BMI of 40.0-44.9, adult   s/p Lap Roux-en-Y gastric bypass 02/27/14   OSA on CPAP   HTN (hypertension)   Time coordinating discharge: 15 minutes  Signed:  [name], MD [facility], [place] [phone] 03/01/2014, 12:50 PM

## 2014-03-01 NOTE — Discharge Instructions (Signed)
GASTRIC BYPASS/SLEEVE  Home Care Instructions   These instructions are to help you care for yourself when you go home.  Call: If you have any problems.   Call [phone] and ask for the surgeon on call   If you need immediate assistance come to the ER at [facility]. Tell the ER staff you are a new post-op gastric bypass or gastric sleeve patient  Signs and symptoms to report:   Severe  vomiting or nausea o If you cannot handle clear liquids for longer than 1 day, call your surgeon   Abdominal pain which does not get better after taking your pain medication   Fever greater than 100.4  F and chills   Heart rate over 100 beats a minute   Trouble breathing   Chest pain   Redness,  swelling, drainage, or foul odor at incision (surgical) sites   If your incisions open or pull apart   Swelling or pain in calf (lower leg)   Diarrhea (Loose bowel movements that happen often), frequent watery, uncontrolled bowel movements   Constipation, (no bowel movements for 3 days) if this happens: o Take Milk of Magnesia, 2 tablespoons by mouth, 3 times a day for 2 days if needed o Stop taking Milk of Magnesia once you have had a bowel movement o Call your doctor if constipation continues Or o Take Miralax  (instead of Milk of Magnesia) following the label instructions o Stop taking Miralax once you have had a bowel movement o Call your doctor if constipation continues   Anything you think is abnormal for you   Normal side effects after surgery:   Unable to sleep at night or unable to concentrate   Irritability   Being tearful (crying) or depressed  These are common complaints, possibly related to your anesthesia, stress of surgery, and change in lifestyle, that usually go away a few weeks after surgery. If these feelings continue, call your medical doctor.  Wound Care: You may have surgical glue, steri-strips, or staples over your incisions after surgery   Surgical glue: Looks like clear  film over your incisions and will wear off a little at a time   Steri-strips: Adhesive strips of tape over your incisions. You may notice a yellowish color on skin under the steri-strips. This is used to make the steri-strips stick better. Do not pull the steri-strips off - let them fall off   Staples: Staples may be removed before you leave the hospital o If you go home with staples, call [facility] for an appointment with your surgeons nurse to have staples removed 10 days after surgery, [phone]   Showering: You may shower two (2) days after your surgery unless your surgeon tells you differently o Wash gently around incisions with warm soapy water, rinse well, and gently pat dry o If you have a drain (tube from your incision), you may need someone to hold this while you shower o No tub baths until staples are removed and incisions are healed   Medications:   Medications should be liquid or crushed if larger than the size of a dime   Extended release pills (medication that releases a little bit at a time through the  day) should not be crushed   Depending on the size and number of medications you take, you may need to space (take a few throughout the day)/change the time you take your medications so that you do not over-fill your pouch (smaller  stomach)   Make sure you follow-up with you primary care physician to make medication changes needed during rapid weight loss and life -style changes   If you have diabetes, follow up with your doctor that orders your diabetes medication(s) within one week after surgery and check your blood sugar regularly    Do not drive while taking narcotics (pain medications)    Do not take acetaminophen (Tylenol) and Roxicet or Lortab Elixir at the same time since these pain medications contain acetaminophen   Diet:  First 2 Weeks You will see the nutritionist about two (2) weeks after your surgery. The nutritionist will increase the types of  foods you can eat if you are handling liquids well:   If you have severe vomiting or nausea and cannot handle clear liquids lasting longer than 1 day call your surgeon Protein Shake   Drink at least 2 ounces of shake 5-6 times per day   Each serving of protein shakes (usually 8-12 ounces) should have a minimum of: o 15 grams of protein o And no more than 5 grams of carbohydrate   Goal for protein each day: o Men = 80 grams per day o Women = 60 grams per day      Protein powder may be added to fluids such as non-fat milk or Lactaid milk or Soy milk (limit to 35 grams added protein powder per serving)  Hydration   Slowly increase the amount of water and other clear liquids as tolerated (See Acceptable Fluids)   Slowly increase the amount of protein shake as tolerated   Sip fluids slowly and throughout the day   May use sugar substitutes in small amounts (no more than 6-8 packets per day; i.e. Splenda)  Fluid Goal   The first goal is to drink at least 8 ounces of protein shake/drink per day (or as directed by the nutritionist); some examples of protein shakes are [employer], [employer], EAS Edge HP, and Unjury. - See handout from pre-op Bariatric Education Class: o Slowly increase the amount of protein shake you drink as tolerated o You may find it easier to slowly sip shakes throughout the day o It is important to get your proteins in first   Your fluid goal is to drink 64-100 ounces of fluid daily o It may take a few weeks to build up to this    32 oz. (or more) should be clear liquids And   32 oz. (or more) should be full liquids (see below for examples)   Liquids should not contain sugar, caffeine, or carbonation  Clear Liquids:   Water of Sugar-free flavored water (i.e. Fruit HO, Propel)   Decaffeinated coffee or tea (sugar-free)   Crystal lite, Wylers Lite, Minute Maid Lite   Sugar-free Jell-O   Bouillon or broth   Sugar-free Popsicle:    - Less than 20 calories  each; Limit 1 per day  Full Liquids:                   Protein Shakes/Drinks + 2 choices per day of other full liquids   Full liquids must be: o No More Than 12 grams of Carbs per serving o No More Than 3 grams of Fat per serving   Strained low-fat cream soup   Non-Fat milk   Fat-free Lactaid Milk   Sugar-free yogurt (Dannon Lite & Fit, Greek yogurt)    Vitamins and Minerals   Start 1 day after surgery unless otherwise directed by  your surgeon   2 Chewable Multivitamin / Multimineral Supplement with iron (i.e. Centrum for Adults)   Vitamin B-12, 350-500 micrograms sub-lingual (place tablet under the tongue) each day   Chewable Calcium Citrate with Vitamin D-3 (Example: 3 Chewable Calcium  Plus 600 with Vitamin D-3) o Take 500 mg three (3) times a day for a total of 1500 mg each day o Do not take all 3 doses of calcium at one time as it may cause constipation, and you can only absorb 500 mg at a time o Do not mix multivitamins containing iron with calcium supplements;  take 2 hours apart o Do not substitute Tums (calcium carbonate) for your calcium   Menstruating women and those at risk for anemia ( a blood disease that causes weakness) may need extra iron o Talk to your doctor to see if you need more iron   If you need extra iron: Total daily Iron recommendation (including Vitamins) is 50 to 100 mg Iron/day   Do not stop taking or change any vitamins or minerals until you talk to your nutritionist or surgeon   Your nutritionist and/or surgeon must approve all vitamin and mineral supplements   Activity and Exercise: It is important to continue walking at home. Limit your physical activity as instructed by your doctor. During this time, use these guidelines:   Do not lift anything greater than ten  (10) pounds for at least two (2) weeks   Do not go back to work or drive until Engineer, production says you can   You may have sex when you feel comfortable o It is VERY important for female  patients to use a reliable birth control method; fertility often increase after surgery o Do not get pregnant for at least 18 months   Start exercising as soon as your doctor tells you that you can o Make sure your doctor approves any physical activity   Start with a simple walking program   Walk 5-15 minutes each day, 7 days per week   Slowly increase until you are walking 30-45 minutes per day   Consider joining our [place] program. [phone] or email [email or web address]   Special Instructions Things to remember:   Free counseling is available for you and your family through collaboration between [facility] and [place]. Please call [phone] and leave a message   Use your CPAP when sleeping if this applies to you   Consider buying a medical alert bracelet that says you had lap-band surgery     You will likely have your first fill (fluid added to your band) 6 - 8 weeks after surgery   [facility] has a free Bariatric Surgery Support Group that meets monthly, the 3rd Thursday, [place]. You can see classes online at [email or web address]   It is very important to keep all follow up appointments with your surgeon, nutritionist, primary care physician, and behavioral health practitioner o After the first year, please follow up with your bariatric surgeon and nutritionist at least once a year in order to maintain best weight loss results                    [facility]:  [facility]: [phone]               Bariatric Nurse Coordinator: [phone]  Gastric Bypass/Sleeve  Home Care Instructions  Rev. 01/2013                                                         Reviewed and Endorsed                                                    by [facility] Patient Education Committee, Jan, 2014

## 2014-03-01 NOTE — Telephone Encounter (Signed)
Spoke to patient this morning and she will come by the office this morning and pick up her FMLA here at the office

## 2014-03-01 NOTE — Progress Notes (Signed)
2 Days Post-Op  Subjective: Some gas pains in center of abd. Tolerated water and protein shakes. No n/v. No difficulty in walking  Objective: Vital signs in last 24 hours: Temp:  [97.6 F (36.4 C)-98.5 F (36.9 C)] 98.5 F (36.9 C) (03/04 0547) Pulse Rate:  [50-65] 58 (03/04 0547) Resp:  [16-18] 16 (03/04 0547) BP: (99-188)/(45-82) 139/66 mmHg (03/04 0547) SpO2:  [97 %-100 %] 97 % (03/04 0547) Last BM Date: 02/27/14  Intake/Output from previous day: 03/03 0701 - 03/04 0700 In: 2770 [P.O.:60; I.V.:2410; IV Piggyback:300] Out: 800 [Urine:800] Intake/Output this shift:    Alert, nad Sitting in chair cta Soft, obese, incisions c/d/i. Expected mild TTP +SCDs. No edema  Lab Results:   Recent Labs  02/28/14 0420 02/28/14 1605 03/01/14 0458  WBC 14.4*  --  10.7*  HGB 11.3* 11.2* 10.6*  HCT 32.5* 33.3* 32.7*  PLT 302  --  284   BMET  Recent Labs  02/28/14 0420  NA 138  K 3.9  CL 103  CO2 22  GLUCOSE 119*  BUN 8  CREATININE 0.74  CALCIUM 9.4   PT/INR No results found for this basename: LABPROT, INR,  in the last 72 hours ABG No results found for this basename: PHART, PCO2, PO2, HCO3,  in the last 72 hours  Studies/Results: Dg Ugi W/water Sol Cm  02/28/2014   CLINICAL DATA:  Status post Roux-en-Y gastric bypass.  EXAM: WATER SOLUBLE UPPER GI SERIES  TECHNIQUE: Single-column upper GI series was performed using water soluble contrast.  CONTRAST:  45 mL Omnipaque 300  COMPARISON:  None.  FLUOROSCOPY TIME:  1 min 12 seconds  FINDINGS: Scout abdominal radiograph demonstrates suture material in the left upper quadrant. Gas is present in nondilated loops of small and large bowel.  The patient drank contrast without difficulty. Contrast passed readily from the esophagus into the gastric pouch. The gastric pouch emptied readily into small bowel without evidence of obstruction. No contrast extravasation was identified to suggest a leak.  IMPRESSION: Unremarkable appearance of  gastric bypass.  No evidence of leak.   Electronically Signed   By: Logan Bores   On: 02/28/2014 10:17    Anti-infectives: Anti-infectives   Start     Dose/Rate Route Frequency Ordered Stop   02/27/14 0532  cefOXitin (MEFOXIN) 2 g in dextrose 5 % 50 mL IVPB     2 g 100 mL/hr over 30 Minutes Intravenous On call to O.R. 02/27/14 0532 02/27/14 0716      Assessment/Plan: s/p Procedure(s): LAPAROSCOPIC ROUX-EN-Y GASTRIC BYPASS WITH UPPER ENDOSCOPY  (N/A)  Morbid obesity HTN OSA on CPAP Low back pain Migraine  Doing well. Vitals ok. No fever. hct stable.  Should be able to go home later this morning Will give some milk of magnesia Discharge teaching by bariatric nurse Will halve dose of home bp meds on discharge for now - discussed with pt  [name]. Redmond Pulling, MD, FACS General, Bariatric, & Minimally Invasive Surgery [facility], [place]   LOS: 2 days    [name] 03/01/2014

## 2014-03-01 NOTE — Progress Notes (Signed)
Patient alert and oriented, pain is controlled. Patient is tolerating fluids, plan to advance to protein shake today. Reviewed Gastric Bypass discharge instructions with patient and patient is able to articulate understanding. Provided information on BELT program, Support Group and [facility] outpatient pharmacy. All questions answered, will continue to monitor.

## 2014-03-01 NOTE — Progress Notes (Signed)
Spoke with nurse who reported pt passed a melanotic stool. No abd pain. Explained that not uncommon to pass old blood after intestinal surgery. Will check orthostatics and repeat cbc prior to discharge.   [name]. Redmond Pulling, MD, FACS General, Bariatric, & Minimally Invasive Surgery [facility], [place]

## 2014-03-01 NOTE — Progress Notes (Signed)
AVS was reviewed with patient. Pt verbalized understanding of d/c insrtuctions. Pt has had no otherwise complaint and is thankful of the care received. Pt was wheeled by CNA. PT is stable necessary  MCCLAIN, Harwood Nall L   03/01/2014 1:57 PM

## 2014-03-02 ENCOUNTER — Telehealth: Payer: Self-pay

## 2014-03-02 NOTE — Telephone Encounter (Signed)
Made discharge phone call to patient per DROP protocol. Asking the following questions.    1. Do you have someone to care for you now that you are home?  YES 2. Are you having pain now that is not relieved by your pain medication?  NO 3. Are you able to drink the recommended daily amount of fluids (48 ounces minimum/day) and protein (60-80 grams/day) as prescribed by the dietitian or nutritional counselor?  WORKING UP TO IT, DRINKING PROTEIN, WATER AND CHICKEN BROTH 4. Are you taking the vitamins and minerals as prescribed?  YES 5. Do you have the "on call" number to contact your surgeon if you have a problem or question?  YES 6. Are your incisions free of redness, swelling or drainage? (If steri strips, address that these can fall off, shower as tolerated) NO 7. Have your bowels moved since your surgery? YES  If not, are you passing gas?   8. Are you up and walking 3-4 times per day?  YES 9. Do you have an appointment to see a dietitian or nutritional counselor in the next month?  YES  The following questions can be added at the center's discharge phone call script at the center's discretion.  The questions are also captured within [employer] project custom fields. 1. Do you have an appointment made to see your surgeon in the next month?  YES 2. Were you provided your discharge medications before your surgery YES or before you were discharged from the hospital and are you taking them without problem?  YES 3. Were you provided phone numbers to the clinic/surgeon's office?  YES 4. Did you watch the patient education video module in the (clinic, surgeon's office, etc.) before your surgery? NO 5. Do you have a discharge checklist that was provided to you in the hospital to reference with instructions on how to take care of yourself after surgery? YES  6. Did you see a dietitian or nutritional counselor while you were in the hospital?  YES

## 2014-03-14 ENCOUNTER — Encounter: Payer: BC Managed Care – PPO | Attending: General Surgery

## 2014-03-14 VITALS — Ht 62.5 in | Wt 235.0 lb

## 2014-03-14 DIAGNOSIS — Z9884 Bariatric surgery status: Secondary | ICD-10-CM | POA: Insufficient documentation

## 2014-03-14 DIAGNOSIS — Z713 Dietary counseling and surveillance: Secondary | ICD-10-CM | POA: Insufficient documentation

## 2014-03-14 DIAGNOSIS — Z6841 Body Mass Index (BMI) 40.0 and over, adult: Secondary | ICD-10-CM

## 2014-03-14 NOTE — Progress Notes (Signed)
Bariatric Class:  Appt start time: 1530 end time:  1630.  2 Week Post-Operative Nutrition Class  Patient was seen on 03/14/2014 for Post[facility]ative [facility]   Surgery date: 02/27/2014 Surgery type: RYGB Start weight at [facility]: 263 lbs on 04/07/13 Weight today: 235.0 lbs Weight change: 21 lbs Total weight loss: 28 lbs  TANITA  BODY COMP RESULTS  02/02/14 03/14/14   BMI (kg/m^2) 46.1 42.3   Fat Mass (lbs) 137.0 122.0   Fat Free Mass (lbs) 119.0 113.0   Total Body Water (lbs) 87.0 82.5     TANITA  BODY COMP RESULTS     BMI (kg/m^2)    Fat Mass (lbs)    Fat Free Mass (lbs)    Total Body Water (lbs)    The following the learning objectives were met by the patient during this course:  Identifies Phase 3A (Soft, High Proteins) Dietary Goals and will begin from 2 weeks post-operatively to 2 months post-operatively  Identifies appropriate sources of fluids and proteins   States protein recommendations and appropriate sources post-operatively  Identifies the need for appropriate texture modifications, mastication, and bite sizes when consuming solids  Identifies appropriate multivitamin and calcium sources post-operatively  Describes the need for physical activity post-operatively and will follow MD recommendations  States when to call healthcare provider regarding medication questions or post-operative complications  Handouts given during class include:  Phase 3A: Soft, High Protein Diet Handout  Follow-Up Plan: Patient will follow-up at [facility] in 6 weeks for 8 week post-op nutrition visit for diet advancement per MD.

## 2014-03-14 NOTE — Patient Instructions (Signed)
Patient to follow Phase 3A-Soft, High Protein Diet and follow-up at [facility] in 6 weeks for 2 months post-op nutrition visit for diet advancement.

## 2014-03-15 ENCOUNTER — Encounter (INDEPENDENT_AMBULATORY_CARE_PROVIDER_SITE_OTHER): Payer: Self-pay | Admitting: General Surgery

## 2014-03-15 ENCOUNTER — Ambulatory Visit (INDEPENDENT_AMBULATORY_CARE_PROVIDER_SITE_OTHER): Payer: BC Managed Care – PPO | Admitting: General Surgery

## 2014-03-15 VITALS — BP 140/80 | HR 76 | Temp 97.8°F | Resp 16 | Ht 62.5 in | Wt 233.8 lb

## 2014-03-15 DIAGNOSIS — Z9884 Bariatric surgery status: Secondary | ICD-10-CM

## 2014-03-15 NOTE — Patient Instructions (Signed)
Increase your atenolol and HCTZ back to your preoperative dose Exercise daily  Eating techniques 20-20-20 (30-30-30) 20 chews, 20 seconds between bites of food, 20 minutes to eat; sometimes you may need 30 chews, 30 seconds etc Use your nondominant hand to eat with Put fork down between bites of food Use a timer after swallowing to reinforce waiting 20-30 sec between bites of food Use a child/infant size utensil Try not to eat while watching TV

## 2014-03-16 NOTE — Progress Notes (Signed)
Subjective:     Patient ID: [name], female   DOB: March 25, 1971, 43 y.o.   MRN: [id number]  HPI 43 year old African female comes in today for her first postoperative appointment. She underwent laparoscopic Roux-en-Y gastric bypass on March 2. She was discharged from the hospital March 4. She states overall she is doing well. She is having some constipation. She denies any fever, chills, nausea or abdominal pain. She denies any regurgitation. She was just advanced to solid proteins yesterday. She reports that she is taking her multivitamin supplements. Currently she is taking half her normal dosages of her blood pressure medications. We've temporarily decreased and on discharge because of some mild hypotension and heart rate in the 60s. She is walking once a day for about 30 minutes at a time. She is not using her CPAP  Review of Systems     Objective:   Physical Exam BP 140/80  Pulse 76  Temp(Src) 97.8 F (36.6 C) (Oral)  Resp 16  Ht 5' 2.5" (1.588 m)  Wt 233 lb 12.8 oz (106.051 kg)  BMI 42.05 kg/m2  LMP 01/05/2014  Gen: alert, NAD, non-toxic appearing Pupils: equal, no scleral icterus Pulm: Lungs clear to auscultation, symmetric chest rise CV: regular rate and rhythm Abd: soft, nontender, nondistended. Well-healed trocar sites. No cellulitis. No incisional hernia Ext: no edema, no calf tenderness Skin: no rash, no jaundice     Assessment:     Status post laparoscopic Roux-en-Y gastric bypass Hypertension Obstructive sleep apnea     Plan:     Overall I think she is doing well. Her preoperative weight was 248. Her initial visit weight was 263. I congratulated her on her weight loss. She was encouraged to continue to be compliant with her supplements. We discussed the importance of routine activity and exercise.  She has been taking her blood pressure at home. She is averaging 130s over 80s. Today she was 140/80. At this point I recommended that she go back to her normal  preoperative dosages of atenolol and HCTZ.  She is not drinking enough liquids to have encouraged her to try to shoot for 64 ounces of liquid a day. We also discussed the use of MiraLAX to help with constipation. I believe by increasing her fluid intake it should also help with her bowel movements.  Followup 3 weeks  [name]. Redmond Pulling, MD, FACS General, Bariatric, & Minimally Invasive Surgery [facility], [place]

## 2014-04-07 ENCOUNTER — Ambulatory Visit (INDEPENDENT_AMBULATORY_CARE_PROVIDER_SITE_OTHER): Payer: BC Managed Care – PPO | Admitting: General Surgery

## 2014-04-07 ENCOUNTER — Encounter (INDEPENDENT_AMBULATORY_CARE_PROVIDER_SITE_OTHER): Payer: Self-pay | Admitting: General Surgery

## 2014-04-07 VITALS — BP 122/82 | HR 68 | Temp 98.3°F | Resp 16 | Ht 62.0 in | Wt 224.8 lb

## 2014-04-07 DIAGNOSIS — Z9884 Bariatric surgery status: Secondary | ICD-10-CM

## 2014-04-07 NOTE — Patient Instructions (Signed)
Continue to walk/exercise regularly - try to exercise at least 4 days a week Follow up with the BELT program - let me know if you have any issues Take your supplements to prevent long term problems Your are doing well!

## 2014-04-07 NOTE — Progress Notes (Signed)
Subjective:     Patient ID: [name], female   DOB: [birth year], 43 y.o.   MRN: [id number]  HPI 43 year old [place] American female comes in for followup after undergoing laparoscopic Roux-en-Y gastric bypass surgery on 02/27/2014. I last saw her in the office on March 18. Her weight at that time was 233 pounds. Her preoperative weight was 248.4 pounds.Her initial visit weight was 263 pounds. She states that she is doing relatively well. She does have some constipation. She is having 3 bowel movements a week. She is taking a laxative on occasion. She denies any nausea, vomiting, heartburn, diarrhea. She does report some hair thinning. She denies any abdominal pain. She denies any lightheadedness or dizziness. She is walking several times a week and sometimes doing a treadmill. Yesterday she did the treadmill for 2 miles. She is averaging about 48 ounces of liquids a day She is still using her CPAP. She is still taking her pressure medicine Review of Systems     Objective:   Physical Exam BP 122/82  Pulse 68  Temp(Src) 98.3 F (36.8 C) (Oral)  Resp 16  Ht 5' 2"  (1.575 m)  Wt 224 lb 12.8 oz (101.969 kg)  BMI 41.11 kg/m2  Gen: alert, NAD, non-toxic appearing Pupils: equal, no scleral icterus Pulm: Lungs clear to auscultation, symmetric chest rise CV: regular rate and rhythm Abd: soft, nontender, nondistended. Well-healed trocar sites. No cellulitis. No incisional hernia Ext: no edema, no calf tenderness Skin: no rash, no jaundice     Assessment:     Status post laparoscopic Roux-en-Y gastric bypass Hypertension Obstructive sleep apnea on CPAP Low back pain     Plan:     Overall I think she is doing well. There appears to be no complications from surgery. However I do think her weight loss could be a little bit more at this time point. Nonetheless I congratulated her on her weight loss. We discussed the importance of routine regular scheduled activity. We discussed how  critical this is for long-term success. We discussed the importance of taking her supplements. She has a friend who had a gastric bypass several years ago who did not take their supplements regularly and is now having some issues with vitamin abnormalities. We discussed the long-term risk of potential osteoporosis if she doesn't take her calcium. We discussed that she should strive to get up to 64 ounces of liquid a day. She would like to be referred to the online version of the belt program. Followup with me in 2 months  [name]. Redmond Pulling, MD, FACS General, Bariatric, & Minimally Invasive Surgery [facility], [place]

## 2014-04-25 ENCOUNTER — Encounter: Payer: BC Managed Care – PPO | Attending: General Surgery | Admitting: Dietician

## 2014-04-25 VITALS — Ht 62.5 in | Wt 217.0 lb

## 2014-04-25 DIAGNOSIS — Z9884 Bariatric surgery status: Secondary | ICD-10-CM | POA: Insufficient documentation

## 2014-04-25 DIAGNOSIS — Z713 Dietary counseling and surveillance: Secondary | ICD-10-CM | POA: Insufficient documentation

## 2014-04-25 NOTE — Patient Instructions (Signed)
Goals:  Follow Phase 3B: High Protein + Non-Starchy Vegetables  Eat 3-6 small meals/snacks, every 3-5 hrs  Increase lean protein foods to meet 60g goal  Increase fluid intake to 64oz +  Avoid drinking 15 minutes before, during and 30 minutes after eating  Aim for >30 min of physical activity daily

## 2014-04-25 NOTE — Progress Notes (Signed)
  Follow-up visit:  8 Weeks Post-Operative RYGB Surgery  Medical Nutrition Therapy:  Appt start time: 9741 end time:  1810.  [name] returns today with an 18 lbs weight loss. Reports tolerating all foods well.   Surgery date: 02/27/2014 Surgery type: RYGB Start weight at [facility]: 263 lbs on 04/07/13 Weight today: 217.0 lbs  Weight change: 18 lbs Total weight loss: 46 lbs  TANITA  BODY COMP RESULTS  02/02/14 03/14/14 04/25/14   BMI (kg/m^2) 46.1 42.3 39.1   Fat Mass (lbs) 137.0 122.0 104.0   Fat Free Mass (lbs) 119.0 113.0 113.0   Total Body Water (lbs) 87.0 82.5 82.5    Preferred Learning Style:   No preference indicated   Learning Readiness:   Ready  24-hr recall: B ( AM): [place] Yogurt (12 g) Snk (AM): cheese stick sometimes (5 g) sometimes L (PM): [name]'s chili or leftovers (2 oz) with green beans (14g) Snk (PM): yogurt or popsicle or meat (12 g)  D (PM): 2 oz meat and green beans (14 g) Snk (PM): maybe popsicle   Fluid intake: 34 oz water + decaf (4 oz) 38 oz total Estimated total protein intake: 45-57 g  Medications: see list Supplementation: taking  Using straws: No Drinking while eating: No Hair loss: Not that she's noticed Carbonated beverages: No N/V/D/C: one time vomited after not chewing chicken, constipation and sometimes will take Miralax Dumping syndrome: No  Recent physical activity:  Walking 4 x week for 30-40 minutes  Progress Towards Goal(s):  In progress.  Handouts given during visit include:  Phase 3B High Protein + Non Starchy Vegetables   Nutritional Diagnosis:  [place]-3.3 Overweight/obesity related to past poor dietary habits and physical inactivity as evidenced by patient w/ recent RYGB surgery following dietary guidelines for continued weight loss.    Intervention:  Nutrition education/diet advancement  Teaching Method Utilized:  Visual Auditory Hands on  Barriers to learning/adherence to lifestyle change: none  Demonstrated degree of  understanding via:  Teach Back   Monitoring/Evaluation:  Dietary intake, exercise, lap band fills, and body weight. Follow up in 1 months for 3 month post-op visit.

## 2014-05-29 ENCOUNTER — Encounter: Payer: BC Managed Care – PPO | Attending: General Surgery | Admitting: Dietician

## 2014-05-29 VITALS — Ht 62.5 in | Wt 207.0 lb

## 2014-05-29 DIAGNOSIS — Z713 Dietary counseling and surveillance: Secondary | ICD-10-CM | POA: Insufficient documentation

## 2014-05-29 DIAGNOSIS — Z9884 Bariatric surgery status: Secondary | ICD-10-CM | POA: Insufficient documentation

## 2014-05-29 DIAGNOSIS — Z6841 Body Mass Index (BMI) 40.0 and over, adult: Secondary | ICD-10-CM

## 2014-05-29 NOTE — Progress Notes (Signed)
  Follow-up visit: 12 Weeks Post-Operative RYGB Surgery  Medical Nutrition Therapy:  Appt start time: 1130 end time:  1200.  [name] returns today with an 10 lbs weight loss. Having a lot of stress lately since her mother had surgery on her spine. Did not tolerate greens and chicken from the cafeteria at the hospital and also vomited after having sunflower seeds on cottage cheese. Other than that reports tolerating foods well.   Surgery date: 02/27/2014 Surgery type: RYGB Start weight at [facility]: 263 lbs on 04/07/13 Weight today: 207 lbs  Weight change: 10 lbs Total weight loss: 56 lbs  TANITA  BODY COMP RESULTS  02/02/14 03/14/14 04/25/14 05/29/14   BMI (kg/m^2) 46.1 42.3 39.1 37.3   Fat Mass (lbs) 137.0 122.0 104.0 95.0   Fat Free Mass (lbs) 119.0 113.0 113.0 112.0   Total Body Water (lbs) 87.0 82.5 82.5 82.0    Preferred Learning Style:   No preference indicated   Learning Readiness:   Ready  24-hr recall: B ( AM): Yogurt (9g) Snk (AM): cheese stick sometimes (5 g)  L (PM): [name]'s chili or leftovers (2 oz) with green beans (14g) Snk (PM): yogurt  (9 g)  D (PM): 2 oz meat and green beans (14 g) Snk (PM): cheese stick (5 g)  Fluid intake: 51-68 oz water + decaf (4 oz) (get more in on days she walks) (55-72 oz) Estimated total protein intake: 56 g  Medications: see list Supplementation: taking  Using straws: No Drinking while eating: No Hair loss: No Carbonated beverages: No N/V/D/C: vomited after hospital food and sunflower seeds, constipation and sometimes will take Miralax Dumping syndrome: No  Recent physical activity:  Walking 5 x week for 30-40 minutes  Progress Towards Goal(s):  In progress.  Handouts given during visit include:  Phase 3B High Protein + Non Starchy Vegetables   Nutritional Diagnosis:  [place]-3.3 Overweight/obesity related to past poor dietary habits and physical inactivity as evidenced by patient w/ recent RYGB surgery following dietary guidelines  for continued weight loss.    Intervention:  Nutrition education/diet advancement  Teaching Method Utilized:  Visual Auditory Hands on  Barriers to learning/adherence to lifestyle change: none  Demonstrated degree of understanding via:  Teach Back   Monitoring/Evaluation:  Dietary intake, exercise, and body weight. Follow up in 3 months for 6 month post-op visit.

## 2014-05-29 NOTE — Patient Instructions (Signed)
Goals:  Follow Phase 3B: High Protein + Non-Starchy Vegetables  Eat 3-6 small meals/snacks, every 3-5 hrs  Increase lean protein foods to meet 60g goal  Increase fluid intake to 64oz +  Avoid drinking 15 minutes before, during and 30 minutes after eating  Aim for >30 min of physical activity daily  Try the citrical petite calcium if you don't want to chew calcium  Continue to eat protein first then non starchy vegetables  If you add any fruit have it after vegetables and limit to less than 1/2 cup

## 2014-07-19 ENCOUNTER — Encounter (INDEPENDENT_AMBULATORY_CARE_PROVIDER_SITE_OTHER): Payer: Self-pay | Admitting: General Surgery

## 2014-07-19 ENCOUNTER — Ambulatory Visit (INDEPENDENT_AMBULATORY_CARE_PROVIDER_SITE_OTHER): Payer: BC Managed Care – PPO | Admitting: General Surgery

## 2014-07-19 VITALS — BP 126/72 | HR 81 | Temp 98.1°F | Resp 16 | Ht 62.5 in | Wt 197.8 lb

## 2014-07-19 DIAGNOSIS — E669 Obesity, unspecified: Secondary | ICD-10-CM

## 2014-07-19 DIAGNOSIS — Z9884 Bariatric surgery status: Secondary | ICD-10-CM

## 2014-07-19 NOTE — Patient Instructions (Signed)
Remember to take your supplements daily Exercise at least 4 times a week Consider adding weight training to increase your muscle mass See you in 3 months  Eating techniques 20-20-20 (30-30-30) 20 chews, 20 seconds between bites of food, 20 minutes to eat; sometimes you may need 30 chews, 30 seconds etc Use your nondominant hand to eat with Put fork down between bites of food Use a timer after swallowing to reinforce waiting 20-30 sec between bites of food Use a child/infant size utensil Try not to eat while watching TV

## 2014-07-21 NOTE — Progress Notes (Signed)
Subjective:     Patient ID: [name], female   DOB: 1971/11/05, 43 y.o.   MRN: [id number]  HPI 43 year old [place] American female comes in for followup after undergoing laparoscopic Roux-en-Y gastric bypass on 02/27/2014. Her last office visit was 04/07/2014. Her weight at that time was 224.8 pounds. She is doing well. She is having some mild constipation - BMs 3-4 times a week. She uses miralax as needed. She reports compliance with her supplements but sometimes forgets. Has no real n/v/reflux. Have some hair thinning. She denies abdominal pain. She didn't do the BELT program. She is walking with some interval power-walking and using the swimming pool. She states she may not be getting all of her protein in. She reports more energy and restriction. Her blood pressure medication was reduced  PMHx, PSHx, SOCHx, FAMHx, ALL reviewed  Review of Systems A 10 point Review of systems was performed and all systems are negative except for what is mentioned in the history of present illness     Objective:   Physical Exam BP 126/72  Pulse 81  Temp(Src) 98.1 F (36.7 C) (Oral)  Resp 16  Ht 5' 2.5" (1.588 m)  Wt 197 lb 12.8 oz (89.721 kg)  BMI 35.58 kg/m2  Gen: alert, NAD, non-toxic appearing Pupils: equal, no scleral icterus Pulm: Lungs clear to auscultation, symmetric chest rise CV: regular rate and rhythm Abd: soft, nontender, nondistended. Well-healed trocar sites. No cellulitis. No incisional hernia Skin: no rash, no jaundice     Assessment:     S/p lap roux en y gastric bypass HTN - improved Low back pain osa on cpap     Plan:     Overall I think she is doing well. Total weight loss from surgery has been 65 pounds. I congratulated her on her weight loss. We discussed the importance of remembering to take her supplements on a daily basis. I also stressed the importance of trying to get around at least 50 g of protein in a daily basis. We also discussed incorporating some  weight training into her exercise regimen. We also discussed trying to increase her exercise to 4 times a week. followup 3 months  [name]. Redmond Pulling, MD, FACS General, Bariatric, & Minimally Invasive Surgery [facility], [place]

## 2014-08-25 ENCOUNTER — Other Ambulatory Visit: Payer: Self-pay | Admitting: Family Medicine

## 2014-08-25 ENCOUNTER — Other Ambulatory Visit (HOSPITAL_COMMUNITY)
Admission: RE | Admit: 2014-08-25 | Discharge: 2014-08-25 | Disposition: A | Discharge status: Home / Self Care | Payer: BC Managed Care – PPO | Source: Ambulatory Visit | Attending: Family Medicine | Admitting: Family Medicine

## 2014-08-25 DIAGNOSIS — Z01419 Encounter for gynecological examination (general) (routine) without abnormal findings: Secondary | ICD-10-CM | POA: Diagnosis not present

## 2014-08-28 LAB — CYTOLOGY - PAP

## 2014-08-29 ENCOUNTER — Ambulatory Visit: Payer: 59 | Admitting: Dietician

## 2014-08-31 ENCOUNTER — Encounter: Payer: BC Managed Care – PPO | Attending: General Surgery | Admitting: Dietician

## 2014-08-31 VITALS — Ht 62.5 in | Wt 193.0 lb

## 2014-08-31 DIAGNOSIS — Z9884 Bariatric surgery status: Secondary | ICD-10-CM | POA: Insufficient documentation

## 2014-08-31 DIAGNOSIS — Z713 Dietary counseling and surveillance: Secondary | ICD-10-CM | POA: Insufficient documentation

## 2014-08-31 DIAGNOSIS — E669 Obesity, unspecified: Secondary | ICD-10-CM

## 2014-08-31 NOTE — Progress Notes (Signed)
  Follow-up visit: 6 Month Post-Operative RYGB Surgery  Medical Nutrition Therapy:  Appt start time: 940 end time:  1010.  [name] returns today with an 14 lbs weight loss. Having a lot of stress lately since her mother had surgery on her spine. August was a tough month since she didn't have enough time to walk since she was caring for other people. Feels like her clothes are fitting better and started wearing misses clothes.  Tolerating foods and nothing is making her sick. Frustrated that weight loss is slowing down and feels like she is off track (not meeting protein or fluid goals).   Surgery date: 02/27/2014 Surgery type: RYGB Start weight at [facility]: 263 lbs on 04/07/13 Weight today: 193.0 lbs  Weight change: 14 lbs Total weight loss: 70 lbs  TANITA  BODY COMP RESULTS  02/02/14 03/14/14 04/25/14 05/29/14 08/31/14   BMI (kg/m^2) 46.1 42.3 39.1 37.3 34.7   Fat Mass (lbs) 137.0 122.0 104.0 95.0 87.0   Fat Free Mass (lbs) 119.0 113.0 113.0 112.0 106.0   Total Body Water (lbs) 87.0 82.5 82.5 82.0 77.5    Preferred Learning Style:   No preference indicated   Learning Readiness:   Ready  24-hr recall: B ( AM): cheese stick (5g) Snk (AM): none L (PM): salad or [employer], Karyl's chili or leftovers (2 oz) with green beans (14g) Snk (PM): yogurt  (9 g)  D (PM): 2 oz meat and green beans (14 g) Snk (PM): cheese stick (5 g)  Fluid intake: 51-68 oz water + coffee (4 oz) less than 64 oz Estimated total protein intake: less than 64 oz  Medications: see list, started atenolol Supplementation: taking, started tablet calcium  Using straws: sometimes at restaurant Drinking while eating: No Hair loss: No Carbonated beverages: No N/V/D/C:  Constipation has improved and sometimes will take Miralax Dumping syndrome: No  Recent physical activity:  Walking 1 x week for 35 minutes, saw a trainer 1 x and planning to add more weights   Progress Towards Goal(s):  In progress.    Nutritional Diagnosis:   [place]-3.3 Overweight/obesity related to past poor dietary habits and physical inactivity as evidenced by patient w/ recent RYGB surgery following dietary guidelines for continued weight loss.    Intervention:  Nutrition education/diet advancement  Teaching Method Utilized:  Visual Auditory Hands on  Barriers to learning/adherence to lifestyle change: none  Demonstrated degree of understanding via:  Teach Back   Monitoring/Evaluation:  Dietary intake, exercise, and body weight. Follow up in 1 months for 7 month post-op visit.

## 2014-08-31 NOTE — Patient Instructions (Signed)
Goals:  Follow Phase 3B: High Protein + Non-Starchy Vegetables  Eat 3-6 small meals/snacks, every 3-5 hrs  Increase lean protein foods to meet 60g goal  Increase fluid intake to 64oz +  Try going to back to 2 protein shakes, a frozen meal, and add protein snacks if needed  Avoid drinking 15 minutes before, during and 30 minutes after eating  Aim for >30 min of physical activity daily  Continue to eat protein first then non starchy vegetables

## 2014-10-02 ENCOUNTER — Ambulatory Visit: Payer: 59 | Admitting: Dietician

## 2014-10-19 ENCOUNTER — Ambulatory Visit (INDEPENDENT_AMBULATORY_CARE_PROVIDER_SITE_OTHER): Payer: BC Managed Care – PPO | Admitting: General Surgery

## 2014-10-19 ENCOUNTER — Other Ambulatory Visit (INDEPENDENT_AMBULATORY_CARE_PROVIDER_SITE_OTHER): Payer: Self-pay

## 2014-10-19 DIAGNOSIS — K909 Intestinal malabsorption, unspecified: Secondary | ICD-10-CM

## 2014-10-19 DIAGNOSIS — T50905A Adverse effect of unspecified drugs, medicaments and biological substances, initial encounter: Principal | ICD-10-CM

## 2014-10-19 DIAGNOSIS — Z9884 Bariatric surgery status: Secondary | ICD-10-CM

## 2016-04-16 ENCOUNTER — Ambulatory Visit (INDEPENDENT_AMBULATORY_CARE_PROVIDER_SITE_OTHER): Payer: BLUE CROSS/BLUE SHIELD | Admitting: Primary Care

## 2016-04-16 ENCOUNTER — Encounter: Payer: Self-pay | Admitting: Primary Care

## 2016-04-16 VITALS — BP 122/76 | HR 73 | Temp 98.0°F | Ht 63.0 in | Wt 216.2 lb

## 2016-04-16 DIAGNOSIS — G43901 Migraine, unspecified, not intractable, with status migrainosus: Secondary | ICD-10-CM

## 2016-04-16 DIAGNOSIS — G43909 Migraine, unspecified, not intractable, without status migrainosus: Secondary | ICD-10-CM | POA: Insufficient documentation

## 2016-04-16 DIAGNOSIS — I10 Essential (primary) hypertension: Secondary | ICD-10-CM

## 2016-04-16 MED ORDER — HYDROCHLOROTHIAZIDE 12.5 MG PO CAPS: 12.5000 mg | ORAL_CAPSULE | Freq: Every day | ORAL | Status: DC

## 2016-04-16 NOTE — Assessment & Plan Note (Signed)
Without meds x 2 months, BP 122/76 today. Will continue to hold Atenolol for now. Refill of HCTZ 12.5 mg provided for lower ankle edema. Will have her continue to monitor BP. Parameters provided for unacceptable reading.

## 2016-04-16 NOTE — Assessment & Plan Note (Signed)
Long history of, currently following with [facility]. Managed on Topamax 25 and Flexeril 5 mg. Next appointment on April 28th.

## 2016-04-16 NOTE — Progress Notes (Signed)
Subjective:    Patient ID: [name], female    DOB: Jan 31, 1971, 45 y.o.   MRN: [id number]  HPI  [name] is a 45 year old female who presents today to establish care and discuss the problems mentioned below. Will obtain old records. She's not had a physical in over 1 year.   1) Essential Hypertension: Diagnosed 12+ years ago. Currently managed on Atenolol 25 mg every other day and HCTZ 12.5 mg daily. She's been without her medication for the past 2 months as she's been out of refills. Her BP is great in the clinic today at 122/76. She's been checking her BP at home once weekly on average and is getting readings of 120's-130's/70's. Denies chest pain, SOB, dizziness. She has noted some lower ankle edema since she's not had her HCTZ and would like a refill for that.  2) Cluster Migraines: History of for years. Currently managed on Topamax 25 mg and Flexeril 5 mg follows, with Dr. Domingo Cocking ([facility] [place]). She will experience headaches once to twice weekly. She has an appointment on April 28 th for re-evaluation.  3) Cough: Present since Friday April 14th. She also reports sore throat, post nasal drip, nasal congestion. She and her husband were outside last week working in the yard and mowing. She's taken several doses of a "severe allergy" tablets with improvement in drainage, not cough. Denies fevers, chills, sore throat.    4) Situational Stress/Anxiety: RX for Alprazolam provided several years ago due to increased family and personal stress. She's not refilled this medication in years. Denies daily anxiety.   Review of Systems  Constitutional: Negative for fever and fatigue.  HENT: Positive for congestion, postnasal drip and sneezing.   Respiratory: Positive for cough. Negative for shortness of breath.   Cardiovascular: Negative for chest pain.  Neurological: Positive for headaches. Negative for dizziness and numbness.  Psychiatric/Behavioral: The patient is not  nervous/anxious.        Past Medical History  Diagnosis Date  . Hypertension   . OSA on CPAP   . Morbid obesity ([place])   . Headache(784.0)     MIGRAINES  . History of chickenpox   . History of migraine      Social History   Social History  . Marital Status: Married    Spouse Name: N/A  . Number of Children: N/A  . Years of Education: N/A   Occupational History  . Not on file.   Social History Main Topics  . Smoking status: Never Smoker   . Smokeless tobacco: Never Used  . Alcohol Use: 0.0 oz/week    0 Standard drinks or equivalent per week     Comment: occasional glass of wine or mixed drink 2 or 3 times a month  . Drug Use: No  . Sexual Activity: Not on file   Other Topics Concern  . Not on file   Social History Narrative   Married.   1 child.   Works at [employer].   Enjoys walking, bowling, Nascar, going to the beach.     Past Surgical History  Procedure Laterality Date  . Cesarean section  05/2005  . Tubal ligation  2011  . Gastric roux-en-y N/A 02/27/2014    Procedure: LAPAROSCOPIC ROUX-EN-Y GASTRIC BYPASS WITH UPPER ENDOSCOPY ;  Surgeon: [name], MD;  Location: [facility];  Service: General;  Laterality: N/A;  . Therapeutic abortion  11    Family History  Problem Relation Age of Onset  .  Cancer Mother     lung  . Emphysema Father   . Heart failure Father   . Cancer Maternal Aunt     breast  . Hypertension Mother   . Diabetes Mother     Allergies  Allergen Reactions  . Latex   . Lisinopril Hives, Itching and Swelling    Current Outpatient Prescriptions on File Prior to Visit  Medication Sig Dispense Refill  . ALPRAZolam (XANAX) 0.25 MG tablet Take 0.25 mg by mouth at bedtime as needed for anxiety.    Marland Kitchen atenolol (TENORMIN) 50 MG tablet Take 25 mg by mouth every other day.     No current facility-administered medications on file prior to visit.    BP 122/76 mmHg  Pulse 73  Temp(Src) 98 F (36.7 C) (Oral)  Ht 5' 3"  (1.6 m)  Wt 216  lb 4 oz (98.09 kg)  BMI 38.32 kg/m2  SpO2 99%  LMP 04/10/2016    Objective:   Physical Exam  Constitutional: She appears well-nourished.  HENT:  Nose: Mucosal edema present. Right sinus exhibits no maxillary sinus tenderness and no frontal sinus tenderness. Left sinus exhibits no maxillary sinus tenderness and no frontal sinus tenderness.  Neck: Neck supple.  Cardiovascular: Normal rate and regular rhythm.   Pulmonary/Chest: Effort normal and breath sounds normal.  Skin: Skin is warm and dry.  Psychiatric: She has a normal mood and affect.          Assessment & Plan:  Allergic Rhinitis:  PND, cough, nasal congestion x 6 days. Exam with clear lungs and nasal mucosal edema. Suspect cough is secondary to PND and will continue to treat with supportive measures. Continue antihistamine, add Delsym DM for congestion. Return precautions provided.

## 2016-04-16 NOTE — Progress Notes (Signed)
Pre visit review using our clinic review tool, if applicable. No additional management support is needed unless otherwise documented below in the visit note. 

## 2016-04-16 NOTE — Patient Instructions (Addendum)
I've sent refills of your hydrochlorothiazide to the pharmacy. Take 1 capsule by mouth every morning for lower leg swelling. Continue to monitor your blood pressure and notify me if you get readings at or above 140/90.  Cough: Try taking Delsym DM for cough and congestion. This can be purchased over the counter.   Continue the allergy pill, just ensure it has "antihistamine" in the ingredients. Please notify me if no improvement in 10 days.  Please schedule a physical with me within the next 3 months. You may also schedule a lab only appointment 3-4 days prior. We will discuss your lab results in detail during your physical.  It was a pleasure to meet you today! Please don't hesitate to call me with any questions. Welcome to [facility]!

## 2016-04-25 DIAGNOSIS — Z79899 Other long term (current) drug therapy: Secondary | ICD-10-CM | POA: Diagnosis not present

## 2016-04-25 DIAGNOSIS — G43019 Migraine without aura, intractable, without status migrainosus: Secondary | ICD-10-CM | POA: Diagnosis not present

## 2016-04-25 DIAGNOSIS — Z049 Encounter for examination and observation for unspecified reason: Secondary | ICD-10-CM | POA: Diagnosis not present

## 2016-04-25 DIAGNOSIS — R51 Headache: Secondary | ICD-10-CM | POA: Diagnosis not present

## 2016-07-08 ENCOUNTER — Other Ambulatory Visit: Payer: Self-pay | Admitting: Primary Care

## 2016-07-08 DIAGNOSIS — Z0000 Encounter for general adult medical examination without abnormal findings: Secondary | ICD-10-CM

## 2016-07-08 DIAGNOSIS — I10 Essential (primary) hypertension: Secondary | ICD-10-CM

## 2016-07-14 ENCOUNTER — Other Ambulatory Visit (INDEPENDENT_AMBULATORY_CARE_PROVIDER_SITE_OTHER): Payer: BLUE CROSS/BLUE SHIELD

## 2016-07-14 DIAGNOSIS — I10 Essential (primary) hypertension: Secondary | ICD-10-CM | POA: Diagnosis not present

## 2016-07-14 DIAGNOSIS — Z0000 Encounter for general adult medical examination without abnormal findings: Secondary | ICD-10-CM

## 2016-07-14 LAB — COMPREHENSIVE METABOLIC PANEL
ALBUMIN: 4.2 g/dL (ref 3.5–5.2)
ALT: 7 U/L (ref 0–35)
AST: 15 U/L (ref 0–37)
Alkaline Phosphatase: 66 U/L (ref 39–117)
BILIRUBIN TOTAL: 0.4 mg/dL (ref 0.2–1.2)
BUN: 10 mg/dL (ref 6–23)
CALCIUM: 9.9 mg/dL (ref 8.4–10.5)
CO2: 28 mEq/L (ref 19–32)
Chloride: 102 mEq/L (ref 96–112)
Creatinine, Ser: 0.84 mg/dL (ref 0.40–1.20)
GFR: 93.48 mL/min (ref >60.00)
Glucose, Bld: 99 mg/dL (ref 70–99)
Potassium: 3.8 mEq/L (ref 3.5–5.1)
Sodium: 137 mEq/L (ref 135–145)
Total Protein: 7.6 g/dL (ref 6.0–8.3)

## 2016-07-14 LAB — HEMOGLOBIN A1C: HEMOGLOBIN A1C: 5.6 % (ref 4.6–6.5)

## 2016-07-14 LAB — LIPID PANEL
CHOLESTEROL: 207 mg/dL — AB (ref 0–200)
HDL: 61.3 mg/dL (ref >39.00)
LDL Cholesterol: 118 mg/dL — ABNORMAL HIGH (ref 0–99)
NONHDL: 146.07
Total CHOL/HDL Ratio: 3
Triglycerides: 141 mg/dL (ref 0.0–149.0)
VLDL: 28.2 mg/dL (ref 0.0–40.0)

## 2016-07-17 ENCOUNTER — Encounter: Payer: Self-pay | Admitting: Primary Care

## 2016-07-17 ENCOUNTER — Ambulatory Visit (INDEPENDENT_AMBULATORY_CARE_PROVIDER_SITE_OTHER): Payer: BLUE CROSS/BLUE SHIELD | Admitting: Primary Care

## 2016-07-17 VITALS — BP 116/80 | HR 71 | Temp 98.2°F | Ht 63.0 in | Wt 212.0 lb

## 2016-07-17 DIAGNOSIS — I10 Essential (primary) hypertension: Secondary | ICD-10-CM

## 2016-07-17 DIAGNOSIS — G43901 Migraine, unspecified, not intractable, with status migrainosus: Secondary | ICD-10-CM

## 2016-07-17 DIAGNOSIS — Z0001 Encounter for general adult medical examination with abnormal findings: Secondary | ICD-10-CM | POA: Insufficient documentation

## 2016-07-17 DIAGNOSIS — Z0000 Encounter for general adult medical examination without abnormal findings: Secondary | ICD-10-CM | POA: Diagnosis not present

## 2016-07-17 DIAGNOSIS — Z1239 Encounter for other screening for malignant neoplasm of breast: Secondary | ICD-10-CM | POA: Diagnosis not present

## 2016-07-17 DIAGNOSIS — Z23 Encounter for immunization: Secondary | ICD-10-CM

## 2016-07-17 DIAGNOSIS — E669 Obesity, unspecified: Secondary | ICD-10-CM

## 2016-07-17 DIAGNOSIS — E785 Hyperlipidemia, unspecified: Secondary | ICD-10-CM | POA: Insufficient documentation

## 2016-07-17 NOTE — Addendum Note (Signed)
Addended by: Jacqualin Combes on: 07/17/2016 09:19 AM   Modules accepted: Orders, SmartSet

## 2016-07-17 NOTE — Progress Notes (Signed)
Subjective:    Patient ID: [name], female    DOB: 10/29/1971, 45 y.o.   MRN: [id number]  HPI  [name] is a 45 year old female who presents today for complete physical.  Immunizations: -Tetanus: Completed in 2007. Due today. -Influenza: Did not complete last season.  Diet: She endorses a poor diet. Breakfast: Skips, coffee, crackers Lunch: Sandwich Dinner: Vegetable, meat, some rice, potatoes, corn Snacks: Crackers, chips, cookies, french fries Desserts: 4 times weekly Beverages: Some water, soda, coffee  Exercise: She does not currently exercise Eye exam: Completed 1.5 years ago, due Dental exam: Completed several years ago Pap Smear: Completed in 2015, due in 2018 Mammogram: Completed in 2015   Review of Systems  Constitutional: Negative for unexpected weight change.  HENT: Negative for rhinorrhea.   Respiratory: Negative for cough and shortness of breath.   Cardiovascular: Negative for chest pain.  Gastrointestinal: Negative for diarrhea and constipation.  Genitourinary: Negative for difficulty urinating and menstrual problem.  Musculoskeletal: Negative for myalgias and arthralgias.  Skin: Negative for rash.  Allergic/Immunologic: Negative for environmental allergies.  Neurological: Negative for dizziness and numbness.       Headaches daily for the past 1 week when waking. Today no headache.  Psychiatric/Behavioral:       Denies concerns for anxiety and depression       Past Medical History  Diagnosis Date  . Hypertension   . OSA on CPAP   . Morbid obesity ([place])   . Headache(784.0)     MIGRAINES  . History of chickenpox   . History of migraine      Social History   Social History  . Marital Status: Married    Spouse Name: N/A  . Number of Children: N/A  . Years of Education: N/A   Occupational History  . Not on file.   Social History Main Topics  . Smoking status: Never Smoker   . Smokeless tobacco: Never Used  . Alcohol Use:  0.0 oz/week    0 Standard drinks or equivalent per week     Comment: occasional glass of wine or mixed drink 2 or 3 times a month  . Drug Use: No  . Sexual Activity: Not on file   Other Topics Concern  . Not on file   Social History Narrative   Married.   1 child.   Works at [employer].   Enjoys walking, bowling, Nascar, going to the beach.     Past Surgical History  Procedure Laterality Date  . Cesarean section  05/2005  . Tubal ligation  2011  . Gastric roux-en-y N/A 02/27/2014    Procedure: LAPAROSCOPIC ROUX-EN-Y GASTRIC BYPASS WITH UPPER ENDOSCOPY ;  Surgeon: [name], MD;  Location: [facility];  Service: General;  Laterality: N/A;  . Therapeutic abortion  45    Family History  Problem Relation Age of Onset  . Cancer Mother     lung  . Emphysema Father   . Heart failure Father   . Cancer Maternal Aunt     breast  . Hypertension Mother   . Diabetes Mother     Allergies  Allergen Reactions  . Latex   . Lisinopril Hives, Itching and Swelling    Current Outpatient Prescriptions on File Prior to Visit  Medication Sig Dispense Refill  . ALPRAZolam (XANAX) 0.25 MG tablet Take 0.25 mg by mouth at bedtime as needed for anxiety.    . ASA-APAP-Caff Buffered (VANQUISH PO) Take 2 capsules by mouth  as needed.    . B COMPLEX VITAMINS SL Place 1 drop under the tongue daily.    . Biotin 5000 MCG CAPS Take 1 capsule by mouth daily.    . Calcium Carbonate-Vitamin D (CALTRATE 600+D PO) Take 1 capsule by mouth 3 (three) times daily.    . hydrochlorothiazide (MICROZIDE) 12.5 MG capsule Take 1 capsule (12.5 mg total) by mouth daily. 90 capsule 2  . IRON PO Take 65 mg by mouth daily.    . Multiple Vitamins-Minerals (MULTI COMPLETE PO) Take 1 capsule by mouth 2 (two) times daily.     No current facility-administered medications on file prior to visit.    BP 116/80 mmHg  Pulse 71  Temp(Src) 98.2 F (36.8 C) (Oral)  Ht 5' 3"  (1.6 m)  Wt 212 lb (96.163 kg)  BMI 37.56 kg/m2   SpO2 98%  LMP 07/04/2016 (Within Days)    Objective:   Physical Exam  Constitutional: She is oriented to person, place, and time. She appears well-nourished.  HENT:  Right Ear: Tympanic membrane and ear canal normal.  Left Ear: Tympanic membrane and ear canal normal.  Nose: Nose normal.  Mouth/Throat: Oropharynx is clear and moist.  Eyes: Conjunctivae and EOM are normal. Pupils are equal, round, and reactive to light.  Neck: Neck supple. No thyromegaly present.  Cardiovascular: Normal rate and regular rhythm.   No murmur heard. Pulmonary/Chest: Effort normal and breath sounds normal. She has no rales.  Abdominal: Soft. Bowel sounds are normal. There is no tenderness.  Musculoskeletal: Normal range of motion.  Lymphadenopathy:    She has no cervical adenopathy.  Neurological: She is alert and oriented to person, place, and time. She has normal reflexes. No cranial nerve deficit.  Skin: Skin is warm and dry. No rash noted.  Psychiatric: She has a normal mood and affect.          Assessment & Plan:

## 2016-07-17 NOTE — Assessment & Plan Note (Signed)
Stable today. Continue HCTZ 12.5 mg. CMP stable.

## 2016-07-17 NOTE — Assessment & Plan Note (Signed)
Long discussion today regarding the importance of a healthy diet and regular exercise in order for weight loss and to reduce risk of other medical diseases. Increase vegetables, fruit, lean protein. Less junk food, starchy foods.

## 2016-07-17 NOTE — Patient Instructions (Signed)
You were provided with a tetanus vaccination today which will cover you for 10 years.  You will be contacted regarding your mammogram.  Please let us know if you have not heard back within one week.   Your cholesterol was slightly elevated. This will reduce with weight loss and exercise.  It is important that you improve your diet. Please limit carbohydrates in the form of white bread, rice, pasta, junk food, cookies, sugary drinks, etc. Increase your consumption of fresh fruits and vegetables.  Ensure you are consuming 64 ounces of water daily.  Start exercising. You should be getting 150 minutes of moderate intensity exercise weekly.  Follow up in 1 year for repeat physical or sooner if needed.  It was a pleasure to see you today!

## 2016-07-17 NOTE — Assessment & Plan Note (Signed)
Tetanus due, provided today. Pap UTD. Mammogram due, ordered today. Discussed the importance of a healthy diet and regular exercise in order for weight loss and to reduce risk of other medical diseases. Exam unremarkable. Labs with slight elevation in cholesterol.  Follow up in 1 year for repeat physical.

## 2016-07-17 NOTE — Assessment & Plan Note (Signed)
Overall stable. No recent migraines.

## 2016-07-17 NOTE — Assessment & Plan Note (Signed)
TC slightly above goal at 207. Trigs and LDL stable. Work on diet, start exercising.

## 2016-07-17 NOTE — Progress Notes (Signed)
Pre visit review using our clinic review tool, if applicable. No additional management support is needed unless otherwise documented below in the visit note. 

## 2016-10-01 ENCOUNTER — Encounter (HOSPITAL_COMMUNITY): Payer: Self-pay

## 2016-10-27 ENCOUNTER — Ambulatory Visit (INDEPENDENT_AMBULATORY_CARE_PROVIDER_SITE_OTHER): Payer: BLUE CROSS/BLUE SHIELD | Admitting: Primary Care

## 2016-10-27 ENCOUNTER — Encounter: Payer: Self-pay | Admitting: Primary Care

## 2016-10-27 VITALS — BP 112/70 | HR 64 | Temp 98.0°F | Ht 63.0 in | Wt 215.8 lb

## 2016-10-27 DIAGNOSIS — M25572 Pain in left ankle and joints of left foot: Secondary | ICD-10-CM | POA: Diagnosis not present

## 2016-10-27 NOTE — Patient Instructions (Signed)
Complete lab work prior to leaving today.   Continue to elevate your ankle every evening when you're home from work.  Purchase a thin ankle brace and wear this for the next 1 week with tennis shoes for support.  You may take ibuprofen 600 mg three times daily as needed for pain and inflammation.   Please notify me if no improvement in 1-2 weeks.  It was a pleasure to see you today!

## 2016-10-27 NOTE — Progress Notes (Signed)
Subjective:    Patient ID: [name], female    DOB: 1971/06/05, 45 y.o.   MRN: [id number]  HPI  [name] is a 45 year old female who presents today with a chief complaint of ankle pain and swelling. Her pain and swelling are located just distal to the left lateral malleolus that has been present for the past several weeks. Her pain is worse when [name] first gets out of bed and will feel it "pull" around to her posterior ankle when walking. Her pain will improve with ambulation during the day but is still noticable. [name] denies any recent/trauma, history of gout. [name] has no pain upon palpation. [name]'s been taking ibuprofen, baclofen without improvement. [name]'s also been elevating her ankle without improvement.   Review of Systems  Constitutional: Negative for fever.  Respiratory: Negative for shortness of breath.   Cardiovascular: Negative for chest pain.  Musculoskeletal: Positive for arthralgias.       Ankle edema  Skin: Negative for color change and wound.       Past Medical History:  Diagnosis Date  . Headache(784.0)    MIGRAINES  . History of chickenpox   . History of migraine   . Hypertension   . Morbid obesity ([place])   . OSA on CPAP      Social History   Social History  . Marital status: Married    Spouse name: N/A  . Number of children: N/A  . Years of education: N/A   Occupational History  . Not on file.   Social History Main Topics  . Smoking status: Never Smoker  . Smokeless tobacco: Never Used  . Alcohol use 0.0 oz/week     Comment: occasional glass of wine or mixed drink 2 or 3 times a month  . Drug use: No  . Sexual activity: Not on file   Other Topics Concern  . Not on file   Social History Narrative   Married.   1 child.   Works at [employer].   Enjoys walking, bowling, Nascar, going to the beach.     Past Surgical History:  Procedure Laterality Date  . CESAREAN SECTION  05/2005  . GASTRIC ROUX-EN-Y N/A 02/27/2014   Procedure:  LAPAROSCOPIC ROUX-EN-Y GASTRIC BYPASS WITH UPPER ENDOSCOPY ;  Surgeon: [name], MD;  Location: [facility];  Service: General;  Laterality: N/A;  . THERAPEUTIC ABORTION  1988  . TUBAL LIGATION  2011    Family History  Problem Relation Age of Onset  . Cancer Mother     lung  . Emphysema Father   . Heart failure Father   . Cancer Maternal Aunt     breast  . Hypertension Mother   . Diabetes Mother     Allergies  Allergen Reactions  . Latex   . Lisinopril Hives, Itching and Swelling    Current Outpatient Prescriptions on File Prior to Visit  Medication Sig Dispense Refill  . ALPRAZolam (XANAX) 0.25 MG tablet Take 0.25 mg by mouth at bedtime as needed for anxiety.    . ASA-APAP-Caff Buffered (VANQUISH PO) Take 2 capsules by mouth as needed.    . B COMPLEX VITAMINS SL Place 1 drop under the tongue daily.    . baclofen (LIORESAL) 10 MG tablet TAKE 1/2-1 TABLET BY MOUTH TWICE DAILY AS NEEDED. LIMIT 2 DAYS PER WEEK  0  . Biotin 5000 MCG CAPS Take 1 capsule by mouth daily.    . Calcium Carbonate-Vitamin D (CALTRATE 600+D PO)  Take 1 capsule by mouth 3 (three) times daily.    . hydrochlorothiazide (MICROZIDE) 12.5 MG capsule Take 1 capsule (12.5 mg total) by mouth daily. 90 capsule 2  . IRON PO Take 65 mg by mouth daily.    . Multiple Vitamins-Minerals (MULTI COMPLETE PO) Take 1 capsule by mouth 2 (two) times daily.    . QUDEXY XR 25 MG CS24 Take 2 capsules by mouth daily.  1   No current facility-administered medications on file prior to visit.     BP 112/70   Pulse 64   Temp 98 F (36.7 C) (Oral)   Ht 5' 3"  (1.6 m)   Wt 215 lb 12 oz (97.9 kg)   LMP 10/20/2016   SpO2 99%   BMI 38.22 kg/m    Objective:   Physical Exam  Constitutional: [name] appears well-nourished.  Cardiovascular: Normal rate and regular rhythm.   Pulmonary/Chest: Effort normal and breath sounds normal.  Musculoskeletal:  Mild ankle edema distal to left lateral malleolus. Non tender. Non pitting. Pain with  rotation of ankle.  Skin: Skin is warm and dry.          Assessment & Plan:  Ankle Pain:  Also with edema x 2-3 weeks. No recent injury.  Exam today with mild edema. Doesn't appear to be gout, although has had history of great toe pain with inflammation so will check Uric acid. Likely fluid retention and accumulation given obesity and sedentary occupation. Will have her wear ankle brace given pain to lateral and posterior ankle. Continue elevation. Continue Ibuprofen PRN. Will check BMP today. No need for imaging. If no improvement, then consider compression hose and imaging.  Sheral Flow, NP

## 2016-10-27 NOTE — Progress Notes (Signed)
Pre visit review using our clinic review tool, if applicable. No additional management support is needed unless otherwise documented below in the visit note. 

## 2016-10-28 LAB — BASIC METABOLIC PANEL
BUN: 13 mg/dL (ref 6–23)
CALCIUM: 9.7 mg/dL (ref 8.4–10.5)
CO2: 27 mEq/L (ref 19–32)
Chloride: 101 mEq/L (ref 96–112)
Creatinine, Ser: 0.79 mg/dL (ref 0.40–1.20)
GFR: 100.21 mL/min (ref >60.00)
GLUCOSE: 88 mg/dL (ref 70–99)
POTASSIUM: 4.1 meq/L (ref 3.5–5.1)
Sodium: 139 mEq/L (ref 135–145)

## 2016-10-28 LAB — URIC ACID: Uric Acid, Serum: 6.3 mg/dL (ref 2.4–7.0)

## 2016-10-29 ENCOUNTER — Telehealth: Payer: Self-pay | Admitting: Primary Care

## 2016-10-29 NOTE — Telephone Encounter (Signed)
Patient returned Chan's call.

## 2016-10-29 NOTE — Telephone Encounter (Signed)
Spoken and notified patient of Kate's comments. Patient verbalized understanding.

## 2016-12-28 ENCOUNTER — Other Ambulatory Visit: Payer: Self-pay | Admitting: Primary Care

## 2016-12-28 DIAGNOSIS — I10 Essential (primary) hypertension: Secondary | ICD-10-CM

## 2017-01-13 DIAGNOSIS — E669 Obesity, unspecified: Secondary | ICD-10-CM | POA: Diagnosis not present

## 2017-01-13 DIAGNOSIS — K912 Postsurgical malabsorption, not elsewhere classified: Secondary | ICD-10-CM | POA: Diagnosis not present

## 2017-01-13 DIAGNOSIS — Z9884 Bariatric surgery status: Secondary | ICD-10-CM | POA: Diagnosis not present

## 2017-01-13 DIAGNOSIS — I10 Essential (primary) hypertension: Secondary | ICD-10-CM | POA: Diagnosis not present

## 2017-07-15 ENCOUNTER — Other Ambulatory Visit: Payer: Self-pay | Admitting: Primary Care

## 2017-07-15 DIAGNOSIS — I10 Essential (primary) hypertension: Secondary | ICD-10-CM

## 2017-08-12 ENCOUNTER — Other Ambulatory Visit: Payer: Self-pay | Admitting: Primary Care

## 2017-08-12 DIAGNOSIS — I10 Essential (primary) hypertension: Secondary | ICD-10-CM

## 2017-09-13 ENCOUNTER — Other Ambulatory Visit: Payer: Self-pay | Admitting: Primary Care

## 2017-09-13 DIAGNOSIS — I10 Essential (primary) hypertension: Secondary | ICD-10-CM

## 2017-10-19 ENCOUNTER — Other Ambulatory Visit: Payer: Self-pay | Admitting: Primary Care

## 2017-10-19 DIAGNOSIS — I10 Essential (primary) hypertension: Secondary | ICD-10-CM

## 2017-10-19 NOTE — Telephone Encounter (Signed)
Pt needs an office visit for any more refills on this prescription, per prescription.

## 2017-10-19 NOTE — Telephone Encounter (Signed)
Please schedule patient for office visit follow up. We will allot 30 days of HCTZ but will need to be seen prior to any additional refills.

## 2017-10-22 NOTE — Telephone Encounter (Signed)
Message left for patient to return my call.  

## 2017-10-22 NOTE — Telephone Encounter (Signed)
Message left for patient to return my call on 10/19/2017

## 2017-10-23 NOTE — Telephone Encounter (Signed)
Spoken to patient and CPE has been scheduled on 11/24/2017

## 2017-11-24 ENCOUNTER — Encounter: Payer: Self-pay | Admitting: Primary Care

## 2017-11-24 ENCOUNTER — Ambulatory Visit (INDEPENDENT_AMBULATORY_CARE_PROVIDER_SITE_OTHER): Payer: BLUE CROSS/BLUE SHIELD | Admitting: Primary Care

## 2017-11-24 VITALS — BP 138/92 | HR 78 | Temp 97.8°F | Ht 63.5 in | Wt 222.0 lb

## 2017-11-24 DIAGNOSIS — Z1231 Encounter for screening mammogram for malignant neoplasm of breast: Secondary | ICD-10-CM | POA: Diagnosis not present

## 2017-11-24 DIAGNOSIS — Z23 Encounter for immunization: Secondary | ICD-10-CM | POA: Diagnosis not present

## 2017-11-24 DIAGNOSIS — G43901 Migraine, unspecified, not intractable, with status migrainosus: Secondary | ICD-10-CM

## 2017-11-24 DIAGNOSIS — E785 Hyperlipidemia, unspecified: Secondary | ICD-10-CM | POA: Diagnosis not present

## 2017-11-24 DIAGNOSIS — I10 Essential (primary) hypertension: Secondary | ICD-10-CM | POA: Diagnosis not present

## 2017-11-24 DIAGNOSIS — F411 Generalized anxiety disorder: Secondary | ICD-10-CM | POA: Insufficient documentation

## 2017-11-24 DIAGNOSIS — E559 Vitamin D deficiency, unspecified: Secondary | ICD-10-CM

## 2017-11-24 DIAGNOSIS — Z1239 Encounter for other screening for malignant neoplasm of breast: Secondary | ICD-10-CM

## 2017-11-24 DIAGNOSIS — Z0000 Encounter for general adult medical examination without abnormal findings: Secondary | ICD-10-CM

## 2017-11-24 DIAGNOSIS — F419 Anxiety disorder, unspecified: Secondary | ICD-10-CM | POA: Diagnosis not present

## 2017-11-24 MED ORDER — TOPIRAMATE 50 MG PO TABS: 50.0000 mg | ORAL_TABLET | Freq: Every day | ORAL | 1 refills | Status: DC

## 2017-11-24 MED ORDER — HYDROXYZINE HCL 10 MG PO TABS: ORAL_TABLET | ORAL | 0 refills | Status: DC

## 2017-11-24 MED ORDER — HYDROCHLOROTHIAZIDE 25 MG PO TABS: 25.0000 mg | ORAL_TABLET | Freq: Every day | ORAL | 0 refills | Status: DC

## 2017-11-24 NOTE — Progress Notes (Signed)
Subjective:    Patient ID: [name], female    DOB: 08-27-1971, 46 y.o.   MRN: [id number]  HPI  [name] is a 46 year old female who presents today for complete physical.  Immunizations: -Tetanus: Completed in 2017 -Influenza: Due   Diet: She endorses a poor diet. Breakfast: Coffee, left overs Lunch: Sandwich, cheese and crackers Dinner: Meat, vegetable, pasta, starch Snacks: Doughnuts, cookies, chips Desserts: Daily  Beverages: Coffee, little water, soda, sweet tea, lemonade, occasional alcohol  Exercise: She is not currently exercising. Eye exam: Completed in January 2018. Dental exam: Completed several years ago. Pap Smear: Completed in 2015, she will reschedule as she declines today. Mammogram: Completed in 2014   Review of Systems  Constitutional: Negative for unexpected weight change.  HENT: Negative for rhinorrhea.   Respiratory: Negative for cough and shortness of breath.   Cardiovascular: Negative for chest pain.  Gastrointestinal: Negative for constipation and diarrhea.  Genitourinary: Negative for difficulty urinating and menstrual problem.  Musculoskeletal: Negative for arthralgias and myalgias.  Skin: Negative for rash.  Allergic/Immunologic: Negative for environmental allergies.  Neurological: Positive for headaches. Negative for dizziness and numbness.  Psychiatric/Behavioral:       Experiences intermittent anxiety including palpitations, mind racing thoughts. Took one tablet every 2 weeks on average.        Past Medical History:  Diagnosis Date  . Headache(784.0)    MIGRAINES  . History of chickenpox   . History of migraine   . Hypertension   . Morbid obesity ([place])   . OSA on CPAP      Social History   Socioeconomic History  . Marital status: Married    Spouse name: Not on file  . Number of children: Not on file  . Years of education: Not on file  . Highest education level: Not on file  Social Needs  . Financial resource  strain: Not on file  . Food insecurity - worry: Not on file  . Food insecurity - inability: Not on file  . Transportation needs - medical: Not on file  . Transportation needs - non-medical: Not on file  Occupational History  . Not on file  Tobacco Use  . Smoking status: Never Smoker  . Smokeless tobacco: Never Used  Substance and Sexual Activity  . Alcohol use: Yes    Alcohol/week: 0.0 oz    Comment: occasional glass of wine or mixed drink 2 or 3 times a month  . Drug use: No  . Sexual activity: Not on file  Other Topics Concern  . Not on file  Social History Narrative   Married.   1 child.   Works at [employer].   Enjoys walking, bowling, Nascar, going to the beach.     Past Surgical History:  Procedure Laterality Date  . CESAREAN SECTION  05/2005  . GASTRIC ROUX-EN-Y N/A 02/27/2014   Procedure: LAPAROSCOPIC ROUX-EN-Y GASTRIC BYPASS WITH UPPER ENDOSCOPY ;  Surgeon: [name], MD;  Location: [facility];  Service: General;  Laterality: N/A;  . THERAPEUTIC ABORTION  1988  . TUBAL LIGATION  2011    Family History  Problem Relation Age of Onset  . Cancer Mother        lung  . Emphysema Father   . Heart failure Father   . Cancer Maternal Aunt        breast  . Hypertension Mother   . Diabetes Mother     Allergies  Allergen Reactions  . Latex   .  Lisinopril Hives, Itching and Swelling    Current Outpatient Medications on File Prior to Visit  Medication Sig Dispense Refill  . ALPRAZolam (XANAX) 0.25 MG tablet Take 0.25 mg by mouth at bedtime as needed for anxiety.    . ASA-APAP-Caff Buffered (VANQUISH PO) Take 2 capsules by mouth as needed.    . B COMPLEX VITAMINS SL Place 1 drop under the tongue daily.    . Biotin 5000 MCG CAPS Take 1 capsule by mouth daily.    . Calcium Carbonate-Vitamin D (CALTRATE 600+D PO) Take 1 capsule by mouth 3 (three) times daily.    . Ibuprofen 200 MG CAPS Take by mouth as needed.    . IRON PO Take 65 mg by mouth daily.    . Multiple  Vitamins-Minerals (MULTI COMPLETE PO) Take 1 capsule by mouth 2 (two) times daily.     No current facility-administered medications on file prior to visit.     BP (!) 138/92   Pulse 78   Temp 97.8 F (36.6 C) (Oral)   Ht 5' 3.5" (1.613 m)   Wt 222 lb (100.7 kg)   LMP 11/23/2017   SpO2 97%   BMI 38.71 kg/m    Objective:   Physical Exam  Constitutional: She is oriented to person, place, and time. She appears well-nourished.  HENT:  Right Ear: Tympanic membrane and ear canal normal.  Left Ear: Tympanic membrane and ear canal normal.  Nose: Nose normal.  Mouth/Throat: Oropharynx is clear and moist.  Eyes: Conjunctivae and EOM are normal. Pupils are equal, round, and reactive to light.  Neck: Neck supple. No thyromegaly present.  Cardiovascular: Normal rate and regular rhythm.  No murmur heard. Pulmonary/Chest: Effort normal and breath sounds normal. She has no rales.  Abdominal: Soft. Bowel sounds are normal. There is no tenderness.  Musculoskeletal: Normal range of motion.  Lymphadenopathy:    She has no cervical adenopathy.  Neurological: She is alert and oriented to person, place, and time. She has normal reflexes. No cranial nerve deficit.  Skin: Skin is warm and dry. No rash noted.  Psychiatric: She has a normal mood and affect.          Assessment & Plan:

## 2017-11-24 NOTE — Assessment & Plan Note (Addendum)
No migraines, but is taking Ibuprofen most everyday for headaches. Not taking Qudexy, has not had in 1-2 years.  Given daily headaches and daily use of Ibuprofen, will refill topiramate. Discussed to notify if symptoms do not improve.

## 2017-11-24 NOTE — Assessment & Plan Note (Signed)
Above goal in the office today, has not been checking regularly at home. Will have her start monitoring at home and report readings at or above 135/90. She's been taking 25 mg for the last few weeks. Continue HCTZ 25 mg. Consider adding Amlodipine.

## 2017-11-24 NOTE — Assessment & Plan Note (Signed)
Lipid panel pending. Discussed the importance of a healthy diet and regular exercise in order for weight loss, and to reduce the risk of other medical problems.

## 2017-11-24 NOTE — Assessment & Plan Note (Signed)
Td UTD, influenza provided today. Pap due, she will schedule to have this done. Mammogram due, pending. Discussed the importance of a healthy diet and regular exercise in order for weight loss, and to reduce the risk of other medical problems. Exam unremarkable. Labs pending. Follow up in 1 year.

## 2017-11-24 NOTE — Patient Instructions (Addendum)
Call the [facility] to schedule your mammogram.  Start exercising. You should be getting 150 minutes of moderate intensity exercise weekly.  It's important to improve your diet by reducing consumption of fast food, fried food, processed snack foods, sugary drinks. Increase consumption of fresh vegetables and fruits, whole grains, water.  Ensure you are drinking 64 ounces of water daily.  Check your blood pressure daily, around the same time of day, for the next 2 weeks.  Ensure that you have rested for 30 minutes prior to checking your blood pressure. Record your readings and call me with those readings. Your blood pressure should run less than 135/90.  We've increased your HCTZ from 12.5 mg to 25 mg.   Start topiramate 50 mg tablets. Take 1 tablet by mouth every night at bedtime.   Try the hydroxyzine tablets as needed for anxiety. Caution as these may cause drowsiness. Let me know if no improvement.  Schedule a lab only appointment to return fasting for your labs. No food 8 hours prior, you may have water and black coffee.  Follow up in one year for your annual exam or sooner if needed/your blood pressure remains above 135/90.  It was a pleasure to see you today!

## 2017-11-24 NOTE — Addendum Note (Signed)
Addended by: Jacqualin Combes on: 11/24/2017 05:15 PM   Modules accepted: Orders

## 2017-11-24 NOTE — Assessment & Plan Note (Signed)
Endorses intermittent symptoms, once on alprazolam for which she took once every 2 weeks. Discouraged alprazolam, especially for daily anxiety symptoms. Will trial PRN hydroxyzine. Discussed to follow up if daily symptoms become unmanageable.

## 2017-12-03 ENCOUNTER — Other Ambulatory Visit: Payer: BLUE CROSS/BLUE SHIELD

## 2017-12-08 ENCOUNTER — Ambulatory Visit: Payer: BLUE CROSS/BLUE SHIELD | Admitting: Primary Care

## 2017-12-23 ENCOUNTER — Ambulatory Visit
Admission: RE | Admit: 2017-12-23 | Discharge: 2017-12-23 | Disposition: A | Discharge status: Home / Self Care | Payer: BLUE CROSS/BLUE SHIELD | Source: Ambulatory Visit | Attending: Primary Care | Admitting: Primary Care

## 2017-12-23 DIAGNOSIS — Z1239 Encounter for other screening for malignant neoplasm of breast: Secondary | ICD-10-CM

## 2017-12-23 DIAGNOSIS — Z1231 Encounter for screening mammogram for malignant neoplasm of breast: Secondary | ICD-10-CM | POA: Diagnosis not present

## 2018-02-15 ENCOUNTER — Other Ambulatory Visit: Payer: Self-pay | Admitting: Primary Care

## 2018-02-15 DIAGNOSIS — I10 Essential (primary) hypertension: Secondary | ICD-10-CM

## 2018-05-12 ENCOUNTER — Other Ambulatory Visit: Payer: Self-pay | Admitting: Primary Care

## 2018-05-12 DIAGNOSIS — G43901 Migraine, unspecified, not intractable, with status migrainosus: Secondary | ICD-10-CM

## 2018-08-11 ENCOUNTER — Other Ambulatory Visit: Payer: Self-pay | Admitting: Primary Care

## 2018-08-11 DIAGNOSIS — I10 Essential (primary) hypertension: Secondary | ICD-10-CM

## 2018-10-04 ENCOUNTER — Ambulatory Visit: Payer: Self-pay | Admitting: Primary Care

## 2018-10-04 ENCOUNTER — Encounter: Payer: Self-pay | Admitting: Family Medicine

## 2018-10-04 ENCOUNTER — Ambulatory Visit: Payer: BLUE CROSS/BLUE SHIELD | Admitting: Family Medicine

## 2018-10-04 VITALS — BP 120/84 | HR 86 | Temp 98.5°F | Ht 63.5 in | Wt 227.0 lb

## 2018-10-04 DIAGNOSIS — E559 Vitamin D deficiency, unspecified: Secondary | ICD-10-CM | POA: Diagnosis not present

## 2018-10-04 DIAGNOSIS — M79674 Pain in right toe(s): Secondary | ICD-10-CM

## 2018-10-04 DIAGNOSIS — I10 Essential (primary) hypertension: Secondary | ICD-10-CM | POA: Diagnosis not present

## 2018-10-04 MED ORDER — PREDNISONE 20 MG PO TABS: ORAL_TABLET | ORAL | 0 refills | Status: DC

## 2018-10-04 NOTE — Progress Notes (Signed)
   Subjective:    Patient ID: [name], female    DOB: Mar 01, 1971, 47 y.o.   MRN: [id number]  HPI This is a 47 yo female who presents today with right foot pain x 1 week. Pain in right great toe.  Has taken ibuprofen 400 mg TID with some relief. Pain with covers touching foot, some swelling, pain worse with walking. Has had similar pain in left foot in past. Eventually resolved. She has not had any trauma or known over use.   Past Medical History:  Diagnosis Date  . Headache(784.0)    MIGRAINES  . History of chickenpox   . History of migraine   . Hypertension   . Morbid obesity ([place])   . OSA on CPAP    Past Surgical History:  Procedure Laterality Date  . CESAREAN SECTION  05/2005  . GASTRIC ROUX-EN-Y N/A 02/27/2014   Procedure: LAPAROSCOPIC ROUX-EN-Y GASTRIC BYPASS WITH UPPER ENDOSCOPY ;  Surgeon: [name], MD;  Location: [facility];  Service: General;  Laterality: N/A;  . THERAPEUTIC ABORTION  1988  . TUBAL LIGATION  2011   Family History  Problem Relation Age of Onset  . Cancer Mother        lung  . Hypertension Mother   . Diabetes Mother   . Emphysema Father   . Heart failure Father   . Cancer Maternal Aunt        breast   Social History   Tobacco Use  . Smoking status: Never Smoker  . Smokeless tobacco: Never Used  Substance Use Topics  . Alcohol use: Yes    Alcohol/week: 0.0 standard drinks    Comment: occasional glass of wine or mixed drink 2 or 3 times a month  . Drug use: No      Review of Systems Per HPI    Objective:   Physical Exam  Constitutional: She is oriented to person, place, and time. She appears well-developed and well-nourished.  HENT:  Head: Normocephalic and atraumatic.  Eyes: Conjunctivae are normal.  Cardiovascular: Normal rate.  Pulmonary/Chest: Effort normal.  Musculoskeletal:       Right foot: There is decreased range of motion, tenderness and swelling (MTP).  Neurological: She is alert and oriented to person, place, and  time.  Skin: Skin is warm and dry.  Psychiatric: She has a normal mood and affect. Her behavior is normal. Judgment and thought content normal.  Vitals reviewed.     BP 120/84 (BP Location: Right Arm, Patient Position: Sitting, Cuff Size: Large)   Pulse 86   Temp 98.5 F (36.9 C) (Oral)   Ht 5' 3.5" (1.613 m)   Wt 227 lb (103 kg)   LMP 09/05/2018   SpO2 98%   BMI 39.58 kg/m      Assessment & Plan:  1. Great toe pain, right - highly suspicious for recurrent gout flare, discussed treatment, will check uric acid level - Provided written and verbal information regarding diagnosis and treatment, including potential side effects from prednisone. - Uric Acid - predniSONE (DELTASONE) 20 MG tablet; Take 3 tablets x 2 days, 2 x 2 days, 1 x 3 days  Dispense: 13 tablet; Refill: 0  2. Vitamin D deficiency - VITAMIN D 25 Hydroxy (Vit-D Deficiency, Fractures)  3. Essential hypertension - Hemoglobin A1c - Lipid panel - Comprehensive metabolic panel   Clarene Reamer, FNP-BC  [facility], [place]  10/04/2018 3:20 PM

## 2018-10-04 NOTE — Patient Instructions (Addendum)
I will notify you of your uric acid level  Take prednisone with food and full glass of water to minimize stomach upset    Gout Gout is painful swelling that can happen in some of your joints. Gout is a type of arthritis. This condition is caused by having too much uric acid in your body. Uric acid is a chemical that is made when your body breaks down substances called purines. If your body has too much uric acid, sharp crystals can form and build up in your joints. This causes pain and swelling. Gout attacks can happen quickly and be very painful (acute gout). Over time, the attacks can affect more joints and happen more often (chronic gout). Follow these instructions at home: During a Gout Attack  If directed, put ice on the painful area: ? Put ice in a plastic bag. ? Place a towel between your skin and the bag. ? Leave the ice on for 20 minutes, 2-3 times a day.  Rest the joint as much as possible. If the joint is in your leg, you may be given crutches to use.  Raise (elevate) the painful joint above the level of your heart as often as you can.  Drink enough fluids to keep your pee (urine) clear or pale yellow.  Take over-the-counter and prescription medicines only as told by your doctor.  Do not drive or use heavy machinery while taking prescription pain medicine.  Follow instructions from your doctor about what you can or cannot eat and drink.  Return to your normal activities as told by your doctor. Ask your doctor what activities are safe for you. Avoiding Future Gout Attacks  Follow a low-purine diet as told by a specialist (dietitian) or your doctor. Avoid foods and drinks that have a lot of purines, such as: ? Liver. ? Kidney. ? Anchovies. ? Asparagus. ? Herring. ? Mushrooms ? Mussels. ? Beer.  Limit alcohol intake to no more than 1 drink a day for nonpregnant women and 2 drinks a day for men. One drink equals 12 oz of beer, 5 oz of wine, or 1 oz of hard  liquor.  Stay at a healthy weight or lose weight if you are overweight. If you want to lose weight, talk with your doctor. It is important that you do not lose weight too fast.  Start or continue an exercise plan as told by your doctor.  Drink enough fluids to keep your pee clear or pale yellow.  Take over-the-counter and prescription medicines only as told by your doctor.  Keep all follow-up visits as told by your doctor. This is important. Contact a doctor if:  You have another gout attack.  You still have symptoms of a gout attack after10 days of treatment.  You have problems (side effects) because of your medicines.  You have chills or a fever.  You have burning pain when you pee (urinate).  You have pain in your lower back or belly. Get help right away if:  You have very bad pain.  Your pain cannot be controlled.  You cannot pee. This information is not intended to replace advice given to you by your health care provider. Make sure you discuss any questions you have with your health care provider. Document Released: 09/23/2008 Document Revised: 05/22/2016 Document Reviewed: 09/27/2015 Elsevier Interactive Patient Education  [employer].

## 2018-10-04 NOTE — Telephone Encounter (Signed)
Pt has appt today with Clarene Reamer at 3:00 regarding her foot pain.  This call was put in the nurse triage cue to call pt.    I called her.  She said,  "I just called in to get an appt".    "I have one today with Ms. Carlean Purl at 3:00".  I asked her if there was anything she needed from me.    She briefly told me she was having pain in her foot.   I let her know that since she had an appt at 3:00 today I would let her discuss it with Neoma Laming.   I asked if there was anything else I could help her with and she said,  "No".   "I just needed an appt which I have".

## 2018-10-05 LAB — COMPREHENSIVE METABOLIC PANEL
ALBUMIN: 4.4 g/dL (ref 3.5–5.2)
ALK PHOS: 63 U/L (ref 39–117)
ALT: 13 U/L (ref 0–35)
AST: 19 U/L (ref 0–37)
BILIRUBIN TOTAL: 0.2 mg/dL (ref 0.2–1.2)
BUN: 10 mg/dL (ref 6–23)
CO2: 28 mEq/L (ref 19–32)
Calcium: 9.7 mg/dL (ref 8.4–10.5)
Chloride: 99 mEq/L (ref 96–112)
Creatinine, Ser: 0.89 mg/dL (ref 0.40–1.20)
GFR: 86.62 mL/min (ref >60.00)
Glucose, Bld: 91 mg/dL (ref 70–99)
POTASSIUM: 4.2 meq/L (ref 3.5–5.1)
SODIUM: 136 meq/L (ref 135–145)
TOTAL PROTEIN: 7.8 g/dL (ref 6.0–8.3)

## 2018-10-05 LAB — VITAMIN D 25 HYDROXY (VIT D DEFICIENCY, FRACTURES): VITD: 19.7 ng/mL — ABNORMAL LOW (ref 30.00–100.00)

## 2018-10-05 LAB — LIPID PANEL
CHOL/HDL RATIO: 3
Cholesterol: 201 mg/dL — ABNORMAL HIGH (ref 0–200)
HDL: 76.3 mg/dL (ref >39.00)
LDL CALC: 105 mg/dL — AB (ref 0–99)
NONHDL: 124.73
Triglycerides: 98 mg/dL (ref 0.0–149.0)
VLDL: 19.6 mg/dL (ref 0.0–40.0)

## 2018-10-05 LAB — HEMOGLOBIN A1C: Hgb A1c MFr Bld: 5.6 % (ref 4.6–6.5)

## 2018-10-05 LAB — URIC ACID: URIC ACID, SERUM: 5.6 mg/dL (ref 2.4–7.0)

## 2018-11-16 ENCOUNTER — Other Ambulatory Visit: Payer: Self-pay | Admitting: Primary Care

## 2018-11-16 DIAGNOSIS — I10 Essential (primary) hypertension: Secondary | ICD-10-CM

## 2018-11-16 DIAGNOSIS — G43901 Migraine, unspecified, not intractable, with status migrainosus: Secondary | ICD-10-CM

## 2018-12-03 ENCOUNTER — Other Ambulatory Visit (HOSPITAL_COMMUNITY)
Admission: RE | Admit: 2018-12-03 | Discharge: 2018-12-03 | Disposition: A | Discharge status: Home / Self Care | Payer: BLUE CROSS/BLUE SHIELD | Source: Ambulatory Visit | Attending: Primary Care | Admitting: Primary Care

## 2018-12-03 ENCOUNTER — Encounter: Payer: Self-pay | Admitting: Primary Care

## 2018-12-03 ENCOUNTER — Ambulatory Visit (INDEPENDENT_AMBULATORY_CARE_PROVIDER_SITE_OTHER): Payer: BLUE CROSS/BLUE SHIELD | Admitting: Primary Care

## 2018-12-03 VITALS — BP 120/82 | HR 85 | Temp 98.2°F | Ht 63.5 in | Wt 224.0 lb

## 2018-12-03 DIAGNOSIS — Z23 Encounter for immunization: Secondary | ICD-10-CM

## 2018-12-03 DIAGNOSIS — K5900 Constipation, unspecified: Secondary | ICD-10-CM

## 2018-12-03 DIAGNOSIS — E559 Vitamin D deficiency, unspecified: Secondary | ICD-10-CM | POA: Insufficient documentation

## 2018-12-03 DIAGNOSIS — F419 Anxiety disorder, unspecified: Secondary | ICD-10-CM

## 2018-12-03 DIAGNOSIS — Z0000 Encounter for general adult medical examination without abnormal findings: Secondary | ICD-10-CM | POA: Diagnosis not present

## 2018-12-03 DIAGNOSIS — Z124 Encounter for screening for malignant neoplasm of cervix: Secondary | ICD-10-CM | POA: Insufficient documentation

## 2018-12-03 DIAGNOSIS — G43901 Migraine, unspecified, not intractable, with status migrainosus: Secondary | ICD-10-CM

## 2018-12-03 DIAGNOSIS — I10 Essential (primary) hypertension: Secondary | ICD-10-CM | POA: Diagnosis not present

## 2018-12-03 DIAGNOSIS — E785 Hyperlipidemia, unspecified: Secondary | ICD-10-CM

## 2018-12-03 DIAGNOSIS — Z9989 Dependence on other enabling machines and devices: Secondary | ICD-10-CM

## 2018-12-03 DIAGNOSIS — G4733 Obstructive sleep apnea (adult) (pediatric): Secondary | ICD-10-CM | POA: Diagnosis not present

## 2018-12-03 DIAGNOSIS — Z1239 Encounter for other screening for malignant neoplasm of breast: Secondary | ICD-10-CM

## 2018-12-03 MED ORDER — VITAMIN D (ERGOCALCIFEROL) 1.25 MG (50000 UNIT) PO CAPS: 50000.0000 [IU] | ORAL_CAPSULE | ORAL | 0 refills | Status: DC

## 2018-12-03 NOTE — Assessment & Plan Note (Signed)
Stable in the office today, continue HCTZ 25 mg. Labs updated.

## 2018-12-03 NOTE — Assessment & Plan Note (Signed)
Compliant to topamax HS, continue same.

## 2018-12-03 NOTE — Assessment & Plan Note (Signed)
Intermittent, moreso prior to her weight loss surgery. Uses Linzess infrequently.

## 2018-12-03 NOTE — Progress Notes (Addendum)
Subjective:    Patient ID: [name], female    DOB: 11/10/1971, 47 y.o.   MRN: [id number]  HPI  [name] is a 47 year old female who presents today for complete physical.  Immunizations: -Tetanus: Completed in 2017 -Influenza: Due today  Diet: [name] endorses a fair diet.  Breakfast: Left overs  Lunch: Left overs Dinner: Meat, starch, vegetables  Snacks: Fruit, yogurt, crackers, cheese, soda, pepperoni  Desserts: 4 nights weekly  Beverages: Coffee, sweet tea, Kool Aid, no water  Exercise: [name] is not exercise  Eye exam: Completed several years ago  Dental exam: Completes regularly Pap Smear: Completed in 2015, due today Mammogram: Due  BP Readings from Last 3 Encounters:  12/03/18 120/82  10/04/18 120/84  11/24/17 (!) 138/92     Review of Systems  Constitutional: Negative for unexpected weight change.  HENT: Negative for rhinorrhea.   Respiratory: Negative for cough and shortness of breath.   Cardiovascular: Negative for chest pain.  Gastrointestinal: Negative for constipation and diarrhea.  Genitourinary: Negative for difficulty urinating and menstrual problem.  Musculoskeletal: Negative for arthralgias.  Skin: Negative for rash.  Allergic/Immunologic: Negative for environmental allergies.  Neurological: Negative for dizziness, numbness and headaches.  Psychiatric/Behavioral: The patient is not nervous/anxious.        Past Medical History:  Diagnosis Date  . Headache(784.0)    MIGRAINES  . History of chickenpox   . History of migraine   . Hypertension   . Morbid obesity ([place])   . OSA on CPAP      Social History   Socioeconomic History  . Marital status: Married    Spouse name: Not on file  . Number of children: Not on file  . Years of education: Not on file  . Highest education level: Not on file  Occupational History  . Not on file  Social Needs  . Financial resource strain: Not on file  . Food insecurity:    Worry: Not on file   Inability: Not on file  . Transportation needs:    Medical: Not on file    Non-medical: Not on file  Tobacco Use  . Smoking status: Never Smoker  . Smokeless tobacco: Never Used  Substance and Sexual Activity  . Alcohol use: Yes    Alcohol/week: 0.0 standard drinks    Comment: occasional glass of wine or mixed drink 2 or 3 times a month  . Drug use: No  . Sexual activity: Not on file  Lifestyle  . Physical activity:    Days per week: Not on file    Minutes per session: Not on file  . Stress: Not on file  Relationships  . Social connections:    Talks on phone: Not on file    Gets together: Not on file    Attends religious service: Not on file    Active member of club or organization: Not on file    Attends meetings of clubs or organizations: Not on file    Relationship status: Not on file  . Intimate partner violence:    Fear of current or ex partner: Not on file    Emotionally abused: Not on file    Physically abused: Not on file    Forced sexual activity: Not on file  Other Topics Concern  . Not on file  Social History Narrative   Married.   1 child.   Works at [employer].   Enjoys walking, bowling, Nascar, going to the beach.  Past Surgical History:  Procedure Laterality Date  . CESAREAN SECTION  05/2005  . GASTRIC ROUX-EN-Y N/A 02/27/2014   Procedure: LAPAROSCOPIC ROUX-EN-Y GASTRIC BYPASS WITH UPPER ENDOSCOPY ;  Surgeon: [name], MD;  Location: [facility];  Service: General;  Laterality: N/A;  . THERAPEUTIC ABORTION  1988  . TUBAL LIGATION  2011    Family History  Problem Relation Age of Onset  . Cancer Mother        lung  . Hypertension Mother   . Diabetes Mother   . Emphysema Father   . Heart failure Father   . Cancer Maternal Aunt        breast    Allergies  Allergen Reactions  . Latex   . Lisinopril Hives, Itching and Swelling    Current Outpatient Medications on File Prior to Visit  Medication Sig Dispense Refill  . ASA-APAP-Caff Buffered  (VANQUISH PO) Take 2 capsules by mouth as needed.    . B COMPLEX VITAMINS SL Place 1 drop under the tongue daily.    . Biotin 5000 MCG CAPS Take 1 capsule by mouth daily.    . Calcium Carbonate-Vitamin D (CALTRATE 600+D PO) Take 1 capsule by mouth 3 (three) times daily.    . hydrochlorothiazide (HYDRODIURIL) 25 MG tablet TAKE 1 TABLET BY MOUTH EVERY DAY 30 tablet 0  . hydrOXYzine (ATARAX/VISTARIL) 10 MG tablet Take 1 tablet by mouth once daily as needed for anxiety. 15 tablet 0  . Ibuprofen 200 MG CAPS Take by mouth as needed.    . IRON PO Take 65 mg by mouth daily.    Marland Kitchen LINZESS 145 MCG CAPS capsule Take 145 mcg by mouth daily.  3  . Multiple Vitamins-Minerals (MULTI COMPLETE PO) Take 1 capsule by mouth 2 (two) times daily.    Marland Kitchen topiramate (TOPAMAX) 50 MG tablet TAKE 1 TABLET BY MOUTH EVERYDAY AT BEDTIME 30 tablet 0   No current facility-administered medications on file prior to visit.     BP 120/82   Pulse 85   Temp 98.2 F (36.8 C) (Oral)   Ht 5' 3.5" (1.613 m)   Wt 224 lb (101.6 kg)   LMP 11/21/2018   SpO2 98%   BMI 39.06 kg/m    Objective:   Physical Exam  Constitutional: [name] is oriented to person, place, and time. [name] appears well-nourished.  HENT:  Mouth/Throat: No oropharyngeal exudate.  Eyes: Pupils are equal, round, and reactive to light. EOM are normal.  Neck: Neck supple. No thyromegaly present.  Cardiovascular: Normal rate and regular rhythm.  Respiratory: Effort normal and breath sounds normal.  GI: Soft. Bowel sounds are normal. There is no tenderness.  Genitourinary: There is no tenderness or lesion on the right labia. There is no tenderness or lesion on the left labia. Cervix exhibits no discharge. Right adnexum displays no tenderness. Left adnexum displays no tenderness. No erythema in the vagina. No vaginal discharge found.  Musculoskeletal: Normal range of motion.  Neurological: [name] is alert and oriented to person, place, and time.  Skin: Skin is warm and  dry.  Psychiatric: [name] has a normal mood and affect.           Assessment & Plan:

## 2018-12-03 NOTE — Assessment & Plan Note (Signed)
Doing well on hydroxyzine, continue same. Uses PRN.

## 2018-12-03 NOTE — Assessment & Plan Note (Signed)
Not using CPAP since weight loss surgery around 2016. No recent snoring.

## 2018-12-03 NOTE — Patient Instructions (Addendum)
Start exercising. You should be getting 150 minutes of moderate intensity exercise weekly.  It's important to improve your diet by reducing consumption of fast food, fried food, processed snack foods, sugary drinks. Increase consumption of fresh vegetables and fruits, whole grains, water.  Ensure you are drinking 64 ounces of water daily.  We will be in touch with your pap smear results once received.  Start vitamin D 50,000 unit capsules. Take 1 capsule by mouth once weekly.   Schedule a lab only appointment for 3 months to repeat your vitamin D.  Follow up in 1 year for your annual exam or sooner if needed.  It was a pleasure to see you today!   Preventive Care 40-64 Years, Female Preventive care refers to lifestyle choices and visits with your health care provider that can promote health and wellness. What does preventive care include?  A yearly physical exam. This is also called an annual well check.  Dental exams once or twice a year.  Routine eye exams. Ask your health care provider how often you should have your eyes checked.  Personal lifestyle choices, including: ? Daily care of your teeth and gums. ? Regular physical activity. ? Eating a healthy diet. ? Avoiding tobacco and drug use. ? Limiting alcohol use. ? Practicing safe sex. ? Taking low-dose aspirin daily starting at age 50. ? Taking vitamin and mineral supplements as recommended by your health care provider. What happens during an annual well check? The services and screenings done by your health care provider during your annual well check will depend on your age, overall health, lifestyle risk factors, and family history of disease. Counseling Your health care provider may ask you questions about your:  Alcohol use.  Tobacco use.  Drug use.  Emotional well-being.  Home and relationship well-being.  Sexual activity.  Eating habits.  Work and work Statistician.  Method of birth  control.  Menstrual cycle.  Pregnancy history.  Screening You may have the following tests or measurements:  Height, weight, and BMI.  Blood pressure.  Lipid and cholesterol levels. These may be checked every 5 years, or more frequently if you are over 59 years old.  Skin check.  Lung cancer screening. You may have this screening every year starting at age 3 if you have a 30-pack-year history of smoking and currently smoke or have quit within the past 15 years.  Fecal occult blood test (FOBT) of the stool. You may have this test every year starting at age 54.  Flexible sigmoidoscopy or colonoscopy. You may have a sigmoidoscopy every 5 years or a colonoscopy every 10 years starting at age 42.  Hepatitis C blood test.  Hepatitis B blood test.  Sexually transmitted disease (STD) testing.  Diabetes screening. This is done by checking your blood sugar (glucose) after you have not eaten for a while (fasting). You may have this done every 1-3 years.  Mammogram. This may be done every 1-2 years. Talk to your health care provider about when you should start having regular mammograms. This may depend on whether you have a family history of breast cancer.  BRCA-related cancer screening. This may be done if you have a family history of breast, ovarian, tubal, or peritoneal cancers.  Pelvic exam and Pap test. This may be done every 3 years starting at age 75. Starting at age 98, this may be done every 5 years if you have a Pap test in combination with an HPV test.  Bone density scan. This  is done to screen for osteoporosis. You may have this scan if you are at high risk for osteoporosis.  Discuss your test results, treatment options, and if necessary, the need for more tests with your health care provider. Vaccines Your health care provider may recommend certain vaccines, such as:  Influenza vaccine. This is recommended every year.  Tetanus, diphtheria, and acellular pertussis (Tdap,  Td) vaccine. You may need a Td booster every 10 years.  Varicella vaccine. You may need this if you have not been vaccinated.  Zoster vaccine. You may need this after age 9.  Measles, mumps, and rubella (MMR) vaccine. You may need at least one dose of MMR if you were born in 1957 or later. You may also need a second dose.  Pneumococcal 13-valent conjugate (PCV13) vaccine. You may need this if you have certain conditions and were not previously vaccinated.  Pneumococcal polysaccharide (PPSV23) vaccine. You may need one or two doses if you smoke cigarettes or if you have certain conditions.  Meningococcal vaccine. You may need this if you have certain conditions.  Hepatitis A vaccine. You may need this if you have certain conditions or if you travel or work in places where you may be exposed to hepatitis A.  Hepatitis B vaccine. You may need this if you have certain conditions or if you travel or work in places where you may be exposed to hepatitis B.  Haemophilus influenzae type b (Hib) vaccine. You may need this if you have certain conditions.  Talk to your health care provider about which screenings and vaccines you need and how often you need them. This information is not intended to replace advice given to you by your health care provider. Make sure you discuss any questions you have with your health care provider. Document Released: 01/11/2016 Document Revised: 09/03/2016 Document Reviewed: 10/16/2015 Elsevier Interactive Patient Education  [employer].

## 2018-12-03 NOTE — Assessment & Plan Note (Signed)
Recent level low at 19. Rx for vitamin D 50,000 units sent to pharmacy for weekly administration. Repeat lab in 3 months.

## 2018-12-03 NOTE — Addendum Note (Signed)
Addended by: Jacqualin Combes on: 12/03/2018 04:02 PM   Modules accepted: Orders

## 2018-12-03 NOTE — Assessment & Plan Note (Signed)
Stable on recent labs. Continue to monitor.

## 2018-12-03 NOTE — Assessment & Plan Note (Signed)
Immunizations UTD. Pap smear due, completed today. Mammogram due, ordered. Colonoscopy due next year. Discussed the importance of a healthy diet and regular exercise in order for weight loss, and to reduce the risk of any potential medical problems. Exam unremarkable. Labs reviewed. Follow up in 1 year for CPE.

## 2018-12-07 LAB — CYTOLOGY - PAP
ADEQUACY: ABSENT
DIAGNOSIS: NEGATIVE
HPV (WINDOPATH): NOT DETECTED

## 2018-12-08 ENCOUNTER — Encounter: Payer: Self-pay | Admitting: *Deleted

## 2018-12-18 ENCOUNTER — Other Ambulatory Visit: Payer: Self-pay | Admitting: Primary Care

## 2018-12-18 DIAGNOSIS — I10 Essential (primary) hypertension: Secondary | ICD-10-CM

## 2018-12-19 ENCOUNTER — Other Ambulatory Visit: Payer: Self-pay | Admitting: Primary Care

## 2018-12-19 DIAGNOSIS — G43901 Migraine, unspecified, not intractable, with status migrainosus: Secondary | ICD-10-CM

## 2019-03-04 ENCOUNTER — Other Ambulatory Visit (INDEPENDENT_AMBULATORY_CARE_PROVIDER_SITE_OTHER): Payer: BLUE CROSS/BLUE SHIELD

## 2019-03-04 DIAGNOSIS — E559 Vitamin D deficiency, unspecified: Secondary | ICD-10-CM | POA: Diagnosis not present

## 2019-03-04 NOTE — Addendum Note (Signed)
Addended by: Pilar Grammes on: 03/04/2019 03:17 PM   Modules accepted: Orders

## 2019-03-05 LAB — VITAMIN D 25 HYDROXY (VIT D DEFICIENCY, FRACTURES): Vit D, 25-Hydroxy: 9 ng/mL — ABNORMAL LOW (ref 30–100)

## 2019-03-07 ENCOUNTER — Other Ambulatory Visit: Payer: Self-pay | Admitting: Primary Care

## 2019-03-07 DIAGNOSIS — E559 Vitamin D deficiency, unspecified: Secondary | ICD-10-CM

## 2019-03-07 MED ORDER — VITAMIN D (ERGOCALCIFEROL) 1.25 MG (50000 UNIT) PO CAPS: 50000.0000 [IU] | ORAL_CAPSULE | ORAL | 1 refills | Status: DC

## 2019-03-09 ENCOUNTER — Other Ambulatory Visit: Payer: Self-pay | Admitting: Primary Care

## 2019-03-09 DIAGNOSIS — E559 Vitamin D deficiency, unspecified: Secondary | ICD-10-CM

## 2019-03-09 MED ORDER — VITAMIN D (ERGOCALCIFEROL) 1.25 MG (50000 UNIT) PO CAPS: 50000.0000 [IU] | ORAL_CAPSULE | ORAL | 1 refills | Status: DC

## 2019-06-10 ENCOUNTER — Other Ambulatory Visit: Payer: Self-pay | Admitting: Primary Care

## 2019-06-10 DIAGNOSIS — E559 Vitamin D deficiency, unspecified: Secondary | ICD-10-CM

## 2019-06-13 ENCOUNTER — Other Ambulatory Visit: Payer: Self-pay

## 2019-06-13 ENCOUNTER — Other Ambulatory Visit: Payer: BLUE CROSS/BLUE SHIELD

## 2019-06-13 ENCOUNTER — Other Ambulatory Visit: Payer: BC Managed Care – PPO

## 2019-06-13 ENCOUNTER — Other Ambulatory Visit (INDEPENDENT_AMBULATORY_CARE_PROVIDER_SITE_OTHER): Payer: BC Managed Care – PPO

## 2019-06-13 DIAGNOSIS — E559 Vitamin D deficiency, unspecified: Secondary | ICD-10-CM | POA: Diagnosis not present

## 2019-06-14 LAB — VITAMIN D 25 HYDROXY (VIT D DEFICIENCY, FRACTURES): VITD: 37.21 ng/mL (ref 30.00–100.00)

## 2019-06-25 ENCOUNTER — Other Ambulatory Visit: Payer: Self-pay | Admitting: Primary Care

## 2019-06-25 DIAGNOSIS — G43901 Migraine, unspecified, not intractable, with status migrainosus: Secondary | ICD-10-CM

## 2019-06-25 DIAGNOSIS — I10 Essential (primary) hypertension: Secondary | ICD-10-CM

## 2019-07-07 DIAGNOSIS — I10 Essential (primary) hypertension: Secondary | ICD-10-CM | POA: Diagnosis not present

## 2019-07-07 DIAGNOSIS — K5909 Other constipation: Secondary | ICD-10-CM | POA: Diagnosis not present

## 2019-07-07 DIAGNOSIS — Z9884 Bariatric surgery status: Secondary | ICD-10-CM | POA: Diagnosis not present

## 2019-07-07 DIAGNOSIS — E669 Obesity, unspecified: Secondary | ICD-10-CM | POA: Diagnosis not present

## 2019-07-31 ENCOUNTER — Other Ambulatory Visit: Payer: Self-pay | Admitting: Primary Care

## 2019-07-31 DIAGNOSIS — G43901 Migraine, unspecified, not intractable, with status migrainosus: Secondary | ICD-10-CM

## 2019-07-31 DIAGNOSIS — I10 Essential (primary) hypertension: Secondary | ICD-10-CM

## 2019-08-21 ENCOUNTER — Other Ambulatory Visit: Payer: Self-pay | Admitting: Primary Care

## 2019-08-21 DIAGNOSIS — E559 Vitamin D deficiency, unspecified: Secondary | ICD-10-CM

## 2019-08-25 ENCOUNTER — Encounter: Payer: Self-pay | Admitting: Dietician

## 2019-08-25 ENCOUNTER — Encounter: Payer: BC Managed Care – PPO | Attending: General Surgery | Admitting: Dietician

## 2019-08-25 ENCOUNTER — Other Ambulatory Visit: Payer: Self-pay

## 2019-08-25 VITALS — Ht 62.0 in | Wt 215.9 lb

## 2019-08-25 DIAGNOSIS — Z6839 Body mass index (BMI) 39.0-39.9, adult: Secondary | ICD-10-CM

## 2019-08-25 DIAGNOSIS — E6609 Other obesity due to excess calories: Secondary | ICD-10-CM | POA: Diagnosis not present

## 2019-08-25 NOTE — Patient Instructions (Signed)
   Follow modified pre-op bariatric diet -- low carb, lean proteins, low-carb veggies to get back on track with bariatric guidelines and weight loss.   Drink at least 64oz of fluids daily, more can help with constipation. Also consider an iron supplement that includes a stool softener.   Stay active as much as possible throughout the day.

## 2019-08-25 NOTE — Progress Notes (Signed)
Medical Nutrition Therapy: Visit start time: 6578  end time: 1645  Assessment:  Diagnosis: obesity Past medical history: [facility] gastric bypass 2015; HTN, gout Psychosocial issues/ stress concerns: none  Preferred learning method:  . Auditory-- discussion . Visual . Hands-on  Current weight: 215.9lbs  Height: 5'2" Medications: reconciled list in medical record Supplements:  multivitamin (not a bariatric formula), iron 62m daily, B12 10076mdaily, Vitamin D 1.2542mnce a week  Progress and evaluation:   Patient reports struggling with constipation, often having a BM every 5 days, but then experiences pain, hard stools followed by diarrhea, and occasionally vomiting when she is able to have a BM.   She has regained some weight in the past year, and is trying to resume diet for promoting weight loss. Her husband and stepson are also working on weight loss.  The family has together started Atkins diet Induction plan within past 2 weeks, eliminating most starches, eating  more veg, lean meats <20g carb. daily; tracking food intake. Patient reports her food intake is well below 1200kcal daily, she is unable to eat much more than 1/4 - 1/3 cup food portions.   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   Highest weight recently has been 221lbs.     Physical activity: walking, yardwork sporadically  Dietary Intake:  Usual eating pattern includes 3 meals and 2-3 snacks per day. Dining out frequency: 2-3 meals per week.  Breakfast: 2sl bacon, 2 boiled eggs; 1 scrambled egg with cheese, bacon bits or sausage Snack: none recently Lunch: leftovers mostly protein -- meat + side salad Snack: >2 weeks ago chips, candy Supper: lean meat grilled + veg, ie baked chicken and cabbage, was eating rice but has  stopped. No fried foods Snack: none Beverages: water, until recently some sugar-sweetened beverages  Nutrition Care Education: Topics covered: weight control, constipation     Weight control: post-op bariatric diet guidelines including emphasis on low-carb vegetables, lean protein to meet goal of 60g daily, low sugar and low fat food and beverage choices; discussed pre-op diet guidelines per patient request and modified to meet post-op diet needs. Constipation: importance of adequate fluid and discussed daily goal; role of fiber and high fiber food choices; avoiding excess proteins and limiting red meats; role of physical activity; potential effect of iron and consideration of stool-softener-containing iron supplement.   Nutritional Diagnosis:  [place]-1.4 Altered GI function As related to constipation.  As evidenced by patient report of infrequent and hard to pass stools. [place]-3.3 Overweight/obesity As related to excess calories and inadequate physical activity.  As evidenced by patient with current BMI of 39.5, and patient report of diet and activity history.  Intervention:   Instruction and discussion as noted above.  Patient has been working on diet changes to resume weight loss and prevent constipation.  Established goals for additional diet and lifestyle change  Patient will discuss iron supplement with MD for best recommendation.  Education Materials given:  . Pre-op diet (modified) . Constipation -- AND bariatric manual . Goals/ instructions   Learner/ who was taught:  . Patient   Level of understanding: . VMarland Kitchenrbalizes/ demonstrates competency  Demonstrated degree of understanding via:   Teach back Learning barriers: . None   Willingness to learn/ readiness for change: . Eager, change in progress  Monitoring and Evaluation:  Dietary intake, exercise, GI symptoms, and body weight      follow up: 10/05/19 at 4:30pm

## 2019-09-06 ENCOUNTER — Ambulatory Visit (INDEPENDENT_AMBULATORY_CARE_PROVIDER_SITE_OTHER): Payer: BC Managed Care – PPO

## 2019-09-06 DIAGNOSIS — Z23 Encounter for immunization: Secondary | ICD-10-CM

## 2019-10-05 ENCOUNTER — Ambulatory Visit: Payer: BC Managed Care – PPO | Admitting: Dietician

## 2019-10-10 ENCOUNTER — Telehealth: Payer: Self-pay | Admitting: Dietician

## 2019-10-10 NOTE — Telephone Encounter (Signed)
Called patient to reschedule her missed appointment from 10/05/19. Left a message reminding her of return to [facility], and requested a call back for her to reschedule.

## 2019-11-02 ENCOUNTER — Other Ambulatory Visit: Payer: Self-pay | Admitting: Primary Care

## 2019-11-02 DIAGNOSIS — G43901 Migraine, unspecified, not intractable, with status migrainosus: Secondary | ICD-10-CM

## 2019-11-28 ENCOUNTER — Other Ambulatory Visit: Payer: Self-pay | Admitting: Primary Care

## 2019-11-28 DIAGNOSIS — I10 Essential (primary) hypertension: Secondary | ICD-10-CM

## 2019-11-30 ENCOUNTER — Telehealth: Payer: Self-pay | Admitting: Primary Care

## 2019-11-30 DIAGNOSIS — I10 Essential (primary) hypertension: Secondary | ICD-10-CM

## 2019-11-30 MED ORDER — HYDROCHLOROTHIAZIDE 25 MG PO TABS: 25.0000 mg | ORAL_TABLET | Freq: Every day | ORAL | 0 refills | Status: DC

## 2019-11-30 NOTE — Telephone Encounter (Signed)
Yes, refill have been sent to [facility]

## 2019-11-30 NOTE — Telephone Encounter (Signed)
Spoken to patient that will refill until her appointment

## 2019-11-30 NOTE — Telephone Encounter (Signed)
Patient scheduled the next available cpx on 01/01/19.  Patient will run out of her medication on 12/31/19. Patient uses [facility]. Can a refill be called in?

## 2019-12-16 ENCOUNTER — Other Ambulatory Visit: Payer: Self-pay | Admitting: Primary Care

## 2019-12-16 DIAGNOSIS — E785 Hyperlipidemia, unspecified: Secondary | ICD-10-CM

## 2019-12-16 DIAGNOSIS — E559 Vitamin D deficiency, unspecified: Secondary | ICD-10-CM

## 2019-12-16 DIAGNOSIS — I10 Essential (primary) hypertension: Secondary | ICD-10-CM

## 2019-12-26 ENCOUNTER — Other Ambulatory Visit (INDEPENDENT_AMBULATORY_CARE_PROVIDER_SITE_OTHER): Payer: BC Managed Care – PPO

## 2019-12-26 ENCOUNTER — Other Ambulatory Visit: Payer: Self-pay

## 2019-12-26 DIAGNOSIS — E559 Vitamin D deficiency, unspecified: Secondary | ICD-10-CM

## 2019-12-26 DIAGNOSIS — E785 Hyperlipidemia, unspecified: Secondary | ICD-10-CM

## 2019-12-26 DIAGNOSIS — I10 Essential (primary) hypertension: Secondary | ICD-10-CM

## 2019-12-26 LAB — VITAMIN D 25 HYDROXY (VIT D DEFICIENCY, FRACTURES): VITD: 24.03 ng/mL — ABNORMAL LOW (ref 30.00–100.00)

## 2019-12-26 LAB — COMPREHENSIVE METABOLIC PANEL
ALT: 10 U/L (ref 0–35)
AST: 16 U/L (ref 0–37)
Albumin: 4.3 g/dL (ref 3.5–5.2)
Alkaline Phosphatase: 58 U/L (ref 39–117)
BUN: 16 mg/dL (ref 6–23)
CO2: 26 mEq/L (ref 19–32)
Calcium: 9.9 mg/dL (ref 8.4–10.5)
Chloride: 102 mEq/L (ref 96–112)
Creatinine, Ser: 0.78 mg/dL (ref 0.40–1.20)
GFR: 94.43 mL/min (ref >60.00)
Glucose, Bld: 102 mg/dL — ABNORMAL HIGH (ref 70–99)
Potassium: 3.7 mEq/L (ref 3.5–5.1)
Sodium: 136 mEq/L (ref 135–145)
Total Bilirubin: 0.4 mg/dL (ref 0.2–1.2)
Total Protein: 7.4 g/dL (ref 6.0–8.3)

## 2019-12-26 LAB — CBC
HCT: 42 % (ref 36.0–46.0)
Hemoglobin: 13.6 g/dL (ref 12.0–15.0)
MCHC: 32.2 g/dL (ref 30.0–36.0)
MCV: 87.2 fl (ref 78.0–100.0)
Platelets: 288 10*3/uL (ref 150.0–400.0)
RBC: 4.82 Mil/uL (ref 3.87–5.11)
RDW: 14.9 % (ref 11.5–15.5)
WBC: 7.3 10*3/uL (ref 4.0–10.5)

## 2019-12-26 LAB — LIPID PANEL
Cholesterol: 200 mg/dL (ref 0–200)
HDL: 77.2 mg/dL (ref >39.00)
LDL Cholesterol: 101 mg/dL — ABNORMAL HIGH (ref 0–99)
NonHDL: 122.32
Total CHOL/HDL Ratio: 3
Triglycerides: 107 mg/dL (ref 0.0–149.0)
VLDL: 21.4 mg/dL (ref 0.0–40.0)

## 2019-12-26 LAB — HEMOGLOBIN A1C: Hgb A1c MFr Bld: 5.4 % (ref 4.6–6.5)

## 2019-12-28 ENCOUNTER — Encounter: Payer: Self-pay | Admitting: Family Medicine

## 2019-12-28 ENCOUNTER — Ambulatory Visit (INDEPENDENT_AMBULATORY_CARE_PROVIDER_SITE_OTHER): Payer: BC Managed Care – PPO | Admitting: Family Medicine

## 2019-12-28 ENCOUNTER — Telehealth: Payer: Self-pay | Admitting: *Deleted

## 2019-12-28 VITALS — Ht 62.0 in | Wt 213.0 lb

## 2019-12-28 DIAGNOSIS — B349 Viral infection, unspecified: Secondary | ICD-10-CM

## 2019-12-28 DIAGNOSIS — R519 Headache, unspecified: Secondary | ICD-10-CM

## 2019-12-28 NOTE — Progress Notes (Signed)
Virtual Visit via Video Note  I connected with [name] on 12/28/19 at  2:00 PM EST by a video enabled telemedicine application and verified that I am speaking with the correct person using two identifiers.  Initially was able to contact via video with patient but connection failed and visit was completed via audio only.  Location: Patient: In her home Provider: [place] Persons participating in virtual visit-patient and provider   I discussed the limitations of evaluation and management by telemedicine and the availability of in person appointments. The patient expressed understanding and agreed to proceed.  History of Present Illness: Chief Complaint  Patient presents with  . Headache    Pt c/o headache, nausea, dizziness, increased thirst, fatigue x 1 day. Little cough while sleeping per husband. Pt has treated HA with Advil/Tylenol, dizziness set in later this morning. Denies fever. Pt is still consistent with Topiramate (when she remembers to take it) - pt did miss a dose last night.   This is a 48 yo female who presents today for video vist with above cc.  Last night she was particularly fatigued she went to bed.  This morning she woke up earlier than usual and had a headache behind her eyes and at her temples.  Headache was worse after she got up and when she tried to watch television.  She has lightheadedness when up and around.  She has had some sneezing her husband told her that she coughed all night.  No fever/chills, shortness of breath, wheeze.  She has a history of migraines and these are typically around her left ear.  She has been working from home.  She was at a gathering at [employer] recently.  She has no known or suspected Covid contacts.  She has taken combination of Advil/Tylenol 2 tablets this morning with some relief of headache.  Past Medical History:  Diagnosis Date  . Headache(784.0)    MIGRAINES  . History of chickenpox   . History of migraine    . Hypertension   . Morbid obesity ([place])   . OSA on CPAP    Past Surgical History:  Procedure Laterality Date  . CESAREAN SECTION  05/2005  . GASTRIC ROUX-EN-Y N/A 02/27/2014   Procedure: LAPAROSCOPIC ROUX-EN-Y GASTRIC BYPASS WITH UPPER ENDOSCOPY ;  Surgeon: [name], MD;  Location: [facility];  Service: General;  Laterality: N/A;  . THERAPEUTIC ABORTION  1988  . TUBAL LIGATION  2011   Family History  Problem Relation Age of Onset  . Cancer Mother        lung  . Hypertension Mother   . Diabetes Mother   . Emphysema Father   . Heart failure Father   . Cancer Maternal Aunt        breast   Social History   Tobacco Use  . Smoking status: Never Smoker  . Smokeless tobacco: Never Used  Substance Use Topics  . Alcohol use: Yes    Alcohol/week: 0.0 standard drinks    Comment: occasional glass of wine or mixed drink 2 or 3 times a month  . Drug use: No      Observations/Objective: Patient is alert and answers questions appropriately.  Visible skin is unremarkable.  She is normally conversive without shortness of breath, audible wheeze or witnessed cough.  Mood and affect are appropriate. Ht 5' 2"  (1.575 m)   Wt 213 lb (96.6 kg)   BMI 38.96 kg/m  Wt Readings from Last 3 Encounters:  12/28/19 213 lb (96.6 kg)  08/25/19 215 lb 14.4 oz (97.9 kg)  12/03/18 224 lb (101.6 kg)    Assessment and Plan: 1. Acute nonintractable headache, unspecified headache type - different from her typical migraine, discussed analgesic use, hydration, rest  2. Viral illness - can not rule out Covid. Advised her to be tested and provided information to schedule testing today - advised her to quarantine until test results obtained - provided information regarding symptomatic treatment - ER/UC precautions reviewed - She was in earlier this week to have blood drawn, she will notify the office if she has a Covid positive test outside of [place].    Clarene Reamer, FNP-BC  [facility]  12/28/2019 1:16 PM   Follow Up Instructions:    I discussed the assessment and treatment plan with the patient. The patient was provided an opportunity to ask questions and all were answered. The patient agreed with the plan and demonstrated an understanding of the instructions.   The patient was advised to call back or seek an in-person evaluation if the symptoms worsen or if the condition fails to improve as anticipated.  Elby Beck, FNP

## 2019-12-28 NOTE — Telephone Encounter (Signed)
Patient called and was transferred to triage because of her symptoms. Patient stated that she woke up this morning with a headache, sneezing, dizziness like the room is spinning and nausea. Patient stated that she has a history of migraines, but this headache is not like her usual migraine. Patient stated when she woke up she took two Advil/tylenol combo pills. Patient stated that she went to sleep last night and was fine. Patient scheduled for a virtual visit with Tor Netters NP today at 2:00. ER precautions given to patient and she verbalized understanding.

## 2019-12-28 NOTE — Telephone Encounter (Signed)
Noted  

## 2019-12-29 ENCOUNTER — Ambulatory Visit: Payer: BC Managed Care – PPO | Attending: Internal Medicine

## 2019-12-29 DIAGNOSIS — Z20822 Contact with and (suspected) exposure to covid-19: Secondary | ICD-10-CM

## 2020-01-01 ENCOUNTER — Telehealth: Payer: Self-pay

## 2020-01-01 NOTE — Telephone Encounter (Signed)
Received call from patient checking Covid results.  Advised no results at this time.

## 2020-01-02 ENCOUNTER — Encounter: Payer: BC Managed Care – PPO | Admitting: Primary Care

## 2020-01-02 ENCOUNTER — Telehealth: Payer: Self-pay

## 2020-01-02 NOTE — Telephone Encounter (Signed)
Caller advise result not back yet

## 2020-01-04 LAB — NOVEL CORONAVIRUS, NAA

## 2020-02-23 ENCOUNTER — Other Ambulatory Visit: Payer: Self-pay | Admitting: Primary Care

## 2020-02-23 DIAGNOSIS — E559 Vitamin D deficiency, unspecified: Secondary | ICD-10-CM

## 2020-03-06 ENCOUNTER — Other Ambulatory Visit: Payer: Self-pay

## 2020-03-06 ENCOUNTER — Ambulatory Visit (INDEPENDENT_AMBULATORY_CARE_PROVIDER_SITE_OTHER): Payer: BC Managed Care – PPO | Admitting: Primary Care

## 2020-03-06 ENCOUNTER — Encounter: Payer: Self-pay | Admitting: Gastroenterology

## 2020-03-06 ENCOUNTER — Encounter: Payer: Self-pay | Admitting: Primary Care

## 2020-03-06 VITALS — BP 120/84 | HR 82 | Temp 96.7°F | Ht 62.0 in | Wt 209.5 lb

## 2020-03-06 DIAGNOSIS — Z1211 Encounter for screening for malignant neoplasm of colon: Secondary | ICD-10-CM

## 2020-03-06 DIAGNOSIS — Z0000 Encounter for general adult medical examination without abnormal findings: Secondary | ICD-10-CM | POA: Diagnosis not present

## 2020-03-06 DIAGNOSIS — Z1231 Encounter for screening mammogram for malignant neoplasm of breast: Secondary | ICD-10-CM

## 2020-03-06 DIAGNOSIS — K5900 Constipation, unspecified: Secondary | ICD-10-CM

## 2020-03-06 DIAGNOSIS — E559 Vitamin D deficiency, unspecified: Secondary | ICD-10-CM

## 2020-03-06 DIAGNOSIS — G43909 Migraine, unspecified, not intractable, without status migrainosus: Secondary | ICD-10-CM | POA: Diagnosis not present

## 2020-03-06 DIAGNOSIS — E785 Hyperlipidemia, unspecified: Secondary | ICD-10-CM

## 2020-03-06 DIAGNOSIS — I10 Essential (primary) hypertension: Secondary | ICD-10-CM

## 2020-03-06 DIAGNOSIS — F419 Anxiety disorder, unspecified: Secondary | ICD-10-CM

## 2020-03-06 MED ORDER — HYDROXYZINE HCL 10 MG PO TABS: 10.0000 mg | ORAL_TABLET | Freq: Two times a day (BID) | ORAL | 0 refills | Status: DC | PRN

## 2020-03-06 MED ORDER — HYDROCHLOROTHIAZIDE 25 MG PO TABS: 25.0000 mg | ORAL_TABLET | Freq: Every day | ORAL | 3 refills | Status: DC

## 2020-03-06 NOTE — Assessment & Plan Note (Signed)
Doing well on Linzess daily, following with GI.  Continue same.

## 2020-03-06 NOTE — Patient Instructions (Signed)
It's important to improve your diet by reducing consumption of fast food, fried food, processed snack foods, sugary drinks. Increase consumption of fresh vegetables and fruits, whole grains, water.  Ensure you are drinking 64 ounces of water daily.  Start exercising. You should be getting 150 minutes of moderate intensity exercise weekly.  You will be contacted regarding your referral to GI for the colonoscopy.  Please let us know if you have not been contacted within two weeks.   Call the breast center to schedule your mammogram.  You may use the hydroxyzine as needed for anxiety. This may cause drowsiness.  Start taking Vitamin D 1000 IU once daily along with the once weekly 50,000 unit capsule.  Schedule a lab appointment for 3 months to recheck the vitamin D.  It was a pleasure to see you today!   Preventive Care 72-51 Years Old, Female Preventive care refers to visits with your health care provider and lifestyle choices that can promote health and wellness. This includes:  A yearly physical exam. This may also be called an annual well check.  Regular dental visits and eye exams.  Immunizations.  Screening for certain conditions.  Healthy lifestyle choices, such as eating a healthy diet, getting regular exercise, not using drugs or products that contain nicotine and tobacco, and limiting alcohol use. What can I expect for my preventive care visit? Physical exam Your health care provider will check your:  Height and weight. This may be used to calculate body mass index (BMI), which tells if you are at a healthy weight.  Heart rate and blood pressure.  Skin for abnormal spots. Counseling Your health care provider may ask you questions about your:  Alcohol, tobacco, and drug use.  Emotional well-being.  Home and relationship well-being.  Sexual activity.  Eating habits.  Work and work Statistician.  Method of birth control.  Menstrual cycle.  Pregnancy  history. What immunizations do I need?  Influenza (flu) vaccine  This is recommended every year. Tetanus, diphtheria, and pertussis (Tdap) vaccine  You may need a Td booster every 10 years. Varicella (chickenpox) vaccine  You may need this if you have not been vaccinated. Zoster (shingles) vaccine  You may need this after age 60. Measles, mumps, and rubella (MMR) vaccine  You may need at least one dose of MMR if you were born in 1957 or later. You may also need a second dose. Pneumococcal conjugate (PCV13) vaccine  You may need this if you have certain conditions and were not previously vaccinated. Pneumococcal polysaccharide (PPSV23) vaccine  You may need one or two doses if you smoke cigarettes or if you have certain conditions. Meningococcal conjugate (MenACWY) vaccine  You may need this if you have certain conditions. Hepatitis A vaccine  You may need this if you have certain conditions or if you travel or work in places where you may be exposed to hepatitis A. Hepatitis B vaccine  You may need this if you have certain conditions or if you travel or work in places where you may be exposed to hepatitis B. Haemophilus influenzae type b (Hib) vaccine  You may need this if you have certain conditions. Human papillomavirus (HPV) vaccine  If recommended by your health care provider, you may need three doses over 6 months. You may receive vaccines as individual doses or as more than one vaccine together in one shot (combination vaccines). Talk with your health care provider about the risks and benefits of combination vaccines. What tests do I  need? Blood tests  Lipid and cholesterol levels. These may be checked every 5 years, or more frequently if you are over 71 years old.  Hepatitis C test.  Hepatitis B test. Screening  Lung cancer screening. You may have this screening every year starting at age 63 if you have a 30-pack-year history of smoking and currently smoke or  have quit within the past 15 years.  Colorectal cancer screening. All adults should have this screening starting at age 82 and continuing until age 73. Your health care provider may recommend screening at age 39 if you are at increased risk. You will have tests every 1-10 years, depending on your results and the type of screening test.  Diabetes screening. This is done by checking your blood sugar (glucose) after you have not eaten for a while (fasting). You may have this done every 1-3 years.  Mammogram. This may be done every 1-2 years. Talk with your health care provider about when you should start having regular mammograms. This may depend on whether you have a family history of breast cancer.  BRCA-related cancer screening. This may be done if you have a family history of breast, ovarian, tubal, or peritoneal cancers.  Pelvic exam and Pap test. This may be done every 3 years starting at age 31. Starting at age 93, this may be done every 5 years if you have a Pap test in combination with an HPV test. Other tests  Sexually transmitted disease (STD) testing.  Bone density scan. This is done to screen for osteoporosis. You may have this scan if you are at high risk for osteoporosis. Follow these instructions at home: Eating and drinking  Eat a diet that includes fresh fruits and vegetables, whole grains, lean protein, and low-fat dairy.  Take vitamin and mineral supplements as recommended by your health care provider.  Do not drink alcohol if: ? Your health care provider tells you not to drink. ? You are pregnant, may be pregnant, or are planning to become pregnant.  If you drink alcohol: ? Limit how much you have to 0-1 drink a day. ? Be aware of how much alcohol is in your drink. In the [place] one drink equals one 12 oz bottle of beer (355 mL), one 5 oz glass of wine (148 mL), or one 1 oz glass of hard liquor (44 mL). Lifestyle  Take daily care of your teeth and gums.  Stay  active. Exercise for at least 30 minutes on 5 or more days each week.  Do not use any products that contain nicotine or tobacco, such as cigarettes, e-cigarettes, and chewing tobacco. If you need help quitting, ask your health care provider.  If you are sexually active, practice safe sex. Use a condom or other form of birth control (contraception) in order to prevent pregnancy and STIs (sexually transmitted infections).  If told by your health care provider, take low-dose aspirin daily starting at age 56. What's next?  Visit your health care provider once a year for a well check visit.  Ask your health care provider how often you should have your eyes and teeth checked.  Stay up to date on all vaccines. This information is not intended to replace advice given to you by your health care provider. Make sure you discuss any questions you have with your health care provider. Document Revised: 08/26/2018 Document Reviewed: 08/26/2018 Elsevier Patient Education  2020 [employer].

## 2020-03-06 NOTE — Assessment & Plan Note (Signed)
Immunizations UTD, will hold off on Shingles vaccine as she will be getting Covid vaccines soon. Pap smear UTD. Mammogram UTD, orders placed. Colonoscopy due, referral to GI placed.  Discussed the importance of a healthy diet and regular exercise in order for weight loss, and to reduce the risk of any potential medical problems.  Exam today stable. Labs reviewed.

## 2020-03-06 NOTE — Assessment & Plan Note (Signed)
Compliant to weekly vitamin D 50,000 units weekly. Recent level of 24 which is too low.  Will add in 1000 units of vitamin D daily.  Continue to monitor.

## 2020-03-06 NOTE — Assessment & Plan Note (Signed)
Well controlled off of medications, continue to monitor.  Discussed the importance of a healthy diet and regular exercise in order for weight loss, and to reduce the risk of any potential medical problems.

## 2020-03-06 NOTE — Assessment & Plan Note (Signed)
Stable in the office today, continue HCTZ 25 mg. BMP reviewed and stable.

## 2020-03-06 NOTE — Assessment & Plan Note (Signed)
Doing well on Topamax nightly, continue same.

## 2020-03-06 NOTE — Progress Notes (Signed)
Subjective:    Patient ID: [name], female    DOB: [birth year], 49 y.o.   MRN: [id number]  HPI  This visit occurred during the SARS-CoV-2 public health emergency.  Safety protocols were in place, including screening questions prior to the visit, additional usage of staff PPE, and extensive cleaning of exam room while observing appropriate contact time as indicated for disinfecting solutions.   [name] is a 49 year old female who presents today for complete physical.  Immunizations: -Tetanus: Completed in 2017 -Influenza: Completed this season  -Shingles: Never completed   Diet: She endorses a fair diet. Exercise: No regular exercise, she planning on walking.  Eye exam: No recent exam Dental exam: Follows semi-annually   Pap Smear: Completed in 2019, due in 2022 Mammogram: No recent imaging.  Colonoscopy: Never completed  BP Readings from Last 3 Encounters:  03/06/20 120/84  12/03/18 120/82  10/04/18 120/84   The 10-year ASCVD risk score Mikey Bussing DC Jr., et al., 2013) is: 1.6%   Values used to calculate the score:     Age: 49 years     Sex: Female     Is Non-Hispanic African American: Yes     Diabetic: No     Tobacco smoker: No     Systolic Blood Pressure: 51 mmHg     Is BP treated: Yes     HDL Cholesterol: 77.2 mg/dL     Total Cholesterol: 200 mg/dL   Review of Systems  Constitutional: Negative for unexpected weight change.  HENT: Negative for rhinorrhea.   Respiratory: Negative for cough and shortness of breath.   Cardiovascular: Negative for chest pain.  Gastrointestinal: Negative for constipation and diarrhea.  Genitourinary: Negative for difficulty urinating and menstrual problem.  Musculoskeletal: Negative for arthralgias.  Skin: Negative for rash.  Allergic/Immunologic: Negative for environmental allergies.  Neurological: Negative for dizziness, numbness and headaches.  Psychiatric/Behavioral: The patient is nervous/anxious.        Recent  anxiety, would like a refill of her hydroxyzine       Past Medical History:  Diagnosis Date  . Headache(784.0)    MIGRAINES  . History of chickenpox   . History of migraine   . Hypertension   . Morbid obesity ([name])   . OSA on CPAP      Social History   Socioeconomic History  . Marital status: Married    Spouse name: Not on file  . Number of children: Not on file  . Years of education: Not on file  . Highest education level: Not on file  Occupational History  . Not on file  Tobacco Use  . Smoking status: Never Smoker  . Smokeless tobacco: Never Used  Substance and Sexual Activity  . Alcohol use: Yes    Alcohol/week: 0.0 standard drinks    Comment: occasional glass of wine or mixed drink 2 or 3 times a month  . Drug use: No  . Sexual activity: Not on file  Other Topics Concern  . Not on file  Social History Narrative   Married.   1 child.   Works at [employer].   Enjoys walking, bowling, Nascar, going to the beach.    Social Determinants of Health   Financial Resource Strain:   . Difficulty of Paying Living Expenses: Not on file  Food Insecurity:   . Worried About Charity fundraiser in the Last Year: Not on file  . Ran Out of Food in the Last Year: Not on file  Transportation Needs:   . Film/video editor (Medical): Not on file  . Lack of Transportation (Non-Medical): Not on file  Physical Activity:   . Days of Exercise per Week: Not on file  . Minutes of Exercise per Session: Not on file  Stress:   . Feeling of Stress : Not on file  Social Connections:   . Frequency of Communication with Friends and Family: Not on file  . Frequency of Social Gatherings with Friends and Family: Not on file  . Attends Religious Services: Not on file  . Active Member of Clubs or Organizations: Not on file  . Attends Archivist Meetings: Not on file  . Marital Status: Not on file  Intimate Partner Violence:   . Fear of Current or Ex-Partner: Not on file  .  Emotionally Abused: Not on file  . Physically Abused: Not on file  . Sexually Abused: Not on file    Past Surgical History:  Procedure Laterality Date  . CESAREAN SECTION  05/2005  . GASTRIC ROUX-EN-Y N/A 02/27/2014   Procedure: LAPAROSCOPIC ROUX-EN-Y GASTRIC BYPASS WITH UPPER ENDOSCOPY ;  Surgeon: [name], MD;  Location: [facility];  Service: General;  Laterality: N/A;  . THERAPEUTIC ABORTION  1988  . TUBAL LIGATION  2011    Family History  Problem Relation Age of Onset  . Cancer Mother        lung  . Hypertension Mother   . Diabetes Mother   . Emphysema Father   . Heart failure Father   . Cancer Maternal Aunt        breast    Allergies  Allergen Reactions  . Latex   . Lisinopril Hives, Itching and Swelling    Current Outpatient Medications on File Prior to Visit  Medication Sig Dispense Refill  . ASA-APAP-Caff Buffered (VANQUISH PO) Take 2 capsules by mouth as needed.    . Calcium Carbonate-Vitamin D (CALTRATE 600+D PO) Take 1 capsule by mouth 3 (three) times daily.    . Cyanocobalamin (VITAMIN B-12 PO) Take by mouth.    . Ibuprofen 200 MG CAPS Take by mouth as needed.    . IRON PO Take 65 mg by mouth daily.    Marland Kitchen LINZESS 145 MCG CAPS capsule Take 145 mcg by mouth daily.  3  . Multiple Vitamins-Minerals (MULTI COMPLETE PO) Take 1 capsule by mouth 2 (two) times daily.    Marland Kitchen topiramate (TOPAMAX) 50 MG tablet TAKE 1 TABLET BY MOUTH EVERYDAY AT BEDTIME 30 tablet 2  . Vitamin D, Ergocalciferol, (DRISDOL) 1.25 MG (50000 UNIT) CAPS capsule TAKE 1 CAPSULE (50,000 UNITS TOTAL) BY MOUTH EVERY 7 (SEVEN) DAYS. 12 capsule 1   No current facility-administered medications on file prior to visit.    BP 120/84   Pulse 82   Temp (!) 96.7 F (35.9 C) (Temporal)   Ht 5' 2"  (1.575 m)   Wt 209 lb 8 oz (95 kg)   LMP 01/03/2020   SpO2 97%   BMI 38.32 kg/m    Objective:   Physical Exam  Constitutional: She is oriented to person, place, and time. She appears well-nourished.  HENT:    Right Ear: Tympanic membrane and ear canal normal.  Left Ear: Tympanic membrane and ear canal normal.  Mouth/Throat: Oropharynx is clear and moist.  Eyes: Pupils are equal, round, and reactive to light. EOM are normal.  Cardiovascular: Normal rate and regular rhythm.  Respiratory: Effort normal and breath sounds normal.  GI: Soft. Bowel sounds  are normal. There is no abdominal tenderness.  Musculoskeletal:        General: Normal range of motion.     Cervical back: Neck supple.  Neurological: She is alert and oriented to person, place, and time. No cranial nerve deficit.  Reflex Scores:      Patellar reflexes are 2+ on the right side and 2+ on the left side. Skin: Skin is warm and dry.  Psychiatric: She has a normal mood and affect.           Assessment & Plan:

## 2020-03-06 NOTE — Assessment & Plan Note (Signed)
Intermittent, more recently increased. Refill provided for hydroxyzine, discussed to notify me if symptoms become daily.

## 2020-03-13 ENCOUNTER — Other Ambulatory Visit: Payer: Self-pay | Admitting: Primary Care

## 2020-03-13 DIAGNOSIS — F419 Anxiety disorder, unspecified: Secondary | ICD-10-CM

## 2020-03-22 ENCOUNTER — Other Ambulatory Visit: Payer: Self-pay

## 2020-03-22 ENCOUNTER — Ambulatory Visit (AMBULATORY_SURGERY_CENTER): Payer: Self-pay | Admitting: *Deleted

## 2020-03-22 VITALS — Temp 96.7°F | Ht 62.0 in | Wt 216.8 lb

## 2020-03-22 DIAGNOSIS — Z1211 Encounter for screening for malignant neoplasm of colon: Secondary | ICD-10-CM

## 2020-03-22 MED ORDER — SUPREP BOWEL PREP KIT 17.5-3.13-1.6 GM/177ML PO SOLN: ORAL | 0 refills | Status: DC

## 2020-03-22 NOTE — Progress Notes (Signed)
covid test at [facility] on 04-02-20  Pt is aware that care partner will wait in the car during procedure; if they feel like they will be too hot or cold to wait in the car; they may wait in the 4 th floor lobby. Patient is aware to bring only one care partner. We want them to wear a mask (we do not have any that we can provide them), practice social distancing, and we will check their temperatures when they get here.  I did remind the patient that their care partner needs to stay in the parking lot the entire time and have a cell phone available, we will call them when the pt is ready for discharge. Patient will wear mask into building.   No trouble with anesthesia, difficulty with intubation or hx/fam hx of malignant hyperthermia per pt  Pt told to continue her Linzess   No egg or soy allergy  No home oxygen use   No medications for weight loss taken  emmi information given  Suprep coupon given and code put into RX

## 2020-04-02 ENCOUNTER — Other Ambulatory Visit: Payer: Self-pay

## 2020-04-02 ENCOUNTER — Other Ambulatory Visit
Admission: RE | Admit: 2020-04-02 | Discharge: 2020-04-02 | Disposition: A | Discharge status: Home / Self Care | Payer: BC Managed Care – PPO | Source: Ambulatory Visit | Attending: Gastroenterology | Admitting: Gastroenterology

## 2020-04-02 DIAGNOSIS — Z01812 Encounter for preprocedural laboratory examination: Secondary | ICD-10-CM | POA: Diagnosis not present

## 2020-04-02 DIAGNOSIS — Z20822 Contact with and (suspected) exposure to covid-19: Secondary | ICD-10-CM | POA: Insufficient documentation

## 2020-04-03 LAB — SARS CORONAVIRUS 2 (TAT 6-24 HRS): SARS Coronavirus 2: NEGATIVE

## 2020-04-05 ENCOUNTER — Encounter: Payer: Self-pay | Admitting: Gastroenterology

## 2020-04-05 ENCOUNTER — Other Ambulatory Visit: Payer: Self-pay

## 2020-04-05 ENCOUNTER — Ambulatory Visit (AMBULATORY_SURGERY_CENTER): Payer: BC Managed Care – PPO | Admitting: Gastroenterology

## 2020-04-05 VITALS — BP 129/64 | HR 75 | Temp 96.8°F | Resp 12 | Ht 62.0 in | Wt 216.0 lb

## 2020-04-05 DIAGNOSIS — Z1211 Encounter for screening for malignant neoplasm of colon: Secondary | ICD-10-CM

## 2020-04-05 DIAGNOSIS — K635 Polyp of colon: Secondary | ICD-10-CM | POA: Diagnosis not present

## 2020-04-05 DIAGNOSIS — D124 Benign neoplasm of descending colon: Secondary | ICD-10-CM | POA: Diagnosis not present

## 2020-04-05 MED ORDER — SODIUM CHLORIDE 0.9 % IV SOLN: 500.0000 mL | INTRAVENOUS | Status: DC

## 2020-04-05 NOTE — Progress Notes (Signed)
Called to room to assist during endoscopic procedure.  Patient ID and intended procedure confirmed with present staff. Received instructions for my participation in the procedure from the performing physician.  

## 2020-04-05 NOTE — Progress Notes (Signed)
Temp LC V/s CW I have reviewed the patient's medical history in detail and updated the computerized patient record.

## 2020-04-05 NOTE — Op Note (Signed)
[place] Patient Name: [name] Procedure Date: 04/05/2020 10:32 AM MRN: [id number] Endoscopist: Mauri Pole , MD Age: 49 Referring MD:  Date of Birth: 1971-08-25 Gender: Female Account #: [id number] Procedure:                Colonoscopy Indications:              Screening for colorectal malignant neoplasm Medicines:                Monitored Anesthesia Care Procedure:                Pre-Anesthesia Assessment:                           - Prior to the procedure, a History and Physical                            was performed, and patient medications and                            allergies were reviewed. The patient's tolerance of                            previous anesthesia was also reviewed. The risks                            and benefits of the procedure and the sedation                            options and risks were discussed with the patient.                            All questions were answered, and informed consent                            was obtained. Prior Anticoagulants: The patient has                            taken no previous anticoagulant or antiplatelet                            agents. ASA Grade Assessment: III - A patient with                            severe systemic disease. After reviewing the risks                            and benefits, the patient was deemed in                            satisfactory condition to undergo the procedure.                           After obtaining informed consent, the colonoscope  was passed under direct vision. Throughout the                            procedure, the patient's blood pressure, pulse, and                            oxygen saturations were monitored continuously. The                            Colonoscope was introduced through the anus and                            advanced to the the cecum, identified by                            appendiceal  orifice and ileocecal valve. The                            colonoscopy was performed without difficulty. The                            patient tolerated the procedure well. The quality                            of the bowel preparation was excellent. The                            ileocecal valve, appendiceal orifice, and rectum                            were photographed. Scope In: 10:37:45 AM Scope Out: 10:51:17 AM Scope Withdrawal Time: 0 hours 8 minutes 43 seconds  Total Procedure Duration: 0 hours 13 minutes 32 seconds  Findings:                 The perianal and digital rectal examinations were                            normal.                           Four sessile polyps were found in the descending                            colon. The polyps were 1 to 2 mm in size. These                            polyps were removed with a cold biopsy forceps.                            Resection and retrieval were complete.                           Non-bleeding internal hemorrhoids were found during  retroflexion. The hemorrhoids were small.                           The exam was otherwise without abnormality. Complications:            No immediate complications. Estimated Blood Loss:     Estimated blood loss was minimal. Impression:               - Four 1 to 2 mm polyps in the descending colon,                            removed with a cold biopsy forceps. Resected and                            retrieved.                           - Non-bleeding internal hemorrhoids.                           - The examination was otherwise normal. Recommendation:           - Patient has a contact number available for                            emergencies. The signs and symptoms of potential                            delayed complications were discussed with the                            patient. Return to normal activities tomorrow.                            Written  discharge instructions were provided to the                            patient.                           - Resume previous diet.                           - Continue present medications.                           - Await pathology results.                           - Repeat colonoscopy in 3 - 10 years for                            surveillance based on pathology results. Mauri Pole, MD 04/05/2020 10:54:49 AM This report has been signed electronically.

## 2020-04-05 NOTE — Progress Notes (Signed)
A/ox3, pleased with MAC, report to RN 

## 2020-04-05 NOTE — Patient Instructions (Addendum)
Thank you for allowing [place] to care for you today!  Resume previous diet and medications today.  Return to your normal activities tomorrow.  Provided handout for hemorrhoid banding for your consideration.  If interested, make appointment with Dr Silverio Decamp.   YOU HAD AN ENDOSCOPIC PROCEDURE TODAY AT THE [facility]:   Refer to the procedure report that was given to you for any specific questions about what was found during the examination.  If the procedure report does not answer your questions, please call your gastroenterologist to clarify.  If you requested that your care partner not be given the details of your procedure findings, then the procedure report has been included in a sealed envelope for you to review at your convenience later.  YOU SHOULD EXPECT: Some feelings of bloating in the abdomen. Passage of more gas than usual.  Walking can help get rid of the air that was put into your GI tract during the procedure and reduce the bloating. If you had a lower endoscopy (such as a colonoscopy or flexible sigmoidoscopy) you may notice spotting of blood in your stool or on the toilet paper. If you underwent a bowel prep for your procedure, you may not have a normal bowel movement for a few days.  Please Note:  You might notice some irritation and congestion in your nose or some drainage.  This is from the oxygen used during your procedure.  There is no need for concern and it should clear up in a day or so.  SYMPTOMS TO REPORT IMMEDIATELY:   Following lower endoscopy (colonoscopy or flexible sigmoidoscopy):  Excessive amounts of blood in the stool  Significant tenderness or worsening of abdominal pains  Swelling of the abdomen that is new, acute  Fever of 100F or higher   For urgent or emergent issues, a gastroenterologist can be reached at any hour by calling [phone]. Do not use MyChart messaging for urgent concerns.    DIET:  We do recommend a small meal at first,  but then you may proceed to your regular diet.  Drink plenty of fluids but you should avoid alcoholic beverages for 24 hours.  ACTIVITY:  You should plan to take it easy for the rest of today and you should NOT DRIVE or use heavy machinery until tomorrow (because of the sedation medicines used during the test).    FOLLOW UP: Our staff will call the number listed on your records 48-72 hours following your procedure to check on you and address any questions or concerns that you may have regarding the information given to you following your procedure. If we do not reach you, we will leave a message.  We will attempt to reach you two times.  During this call, we will ask if you have developed any symptoms of COVID 19. If you develop any symptoms (ie: fever, flu-like symptoms, shortness of breath, cough etc.) before then, please call [phone].  If you test positive for Covid 19 in the 2 weeks post procedure, please call and report this information to [place].    If any biopsies were taken you will be contacted by phone or by letter within the next 1-3 weeks.  Please call us at [phone] if you have not heard about the biopsies in 3 weeks.    SIGNATURES/CONFIDENTIALITY: You and/or your care partner have signed paperwork which will be entered into your electronic medical record.  These signatures attest to the fact that that the information above on your  After Visit Summary has been reviewed and is understood.  Full responsibility of the confidentiality of this discharge information lies with you and/or your care-partner.

## 2020-04-09 ENCOUNTER — Telehealth: Payer: Self-pay | Admitting: *Deleted

## 2020-04-09 NOTE — Telephone Encounter (Signed)
1. Have you developed a fever since your procedure? no  2.   Have you had an respiratory symptoms (SOB or cough) since your procedure? no  3.   Have you tested positive for COVID 19 since your procedure no  4.   Have you had any family members/close contacts diagnosed with the COVID 19 since your procedure?  no   If yes to any of these questions please route to Joylene John, RN and Erenest Rasher, RN Follow up Call-  Call back number 04/05/2020  Post procedure Call Back phone  # [phone]  Permission to leave phone message Yes  Some recent data might be hidden     Patient questions:  Do you have a fever, pain , or abdominal swelling? No. Pain Score  0 *  Have you tolerated food without any problems? Yes.    Have you been able to return to your normal activities? Yes.    Do you have any questions about your discharge instructions: Diet   No. Medications  No. Follow up visit  No.  Do you have questions or concerns about your Care? No.  Actions: * If pain score is 4 or above: No action needed, pain <4.

## 2020-04-13 ENCOUNTER — Encounter: Payer: Self-pay | Admitting: Gastroenterology

## 2020-05-23 ENCOUNTER — Other Ambulatory Visit: Payer: Self-pay | Admitting: Primary Care

## 2020-05-23 ENCOUNTER — Ambulatory Visit: Payer: BC Managed Care – PPO

## 2020-05-23 ENCOUNTER — Ambulatory Visit
Admission: RE | Admit: 2020-05-23 | Discharge: 2020-05-23 | Disposition: A | Discharge status: Home / Self Care | Payer: BC Managed Care – PPO | Source: Ambulatory Visit | Attending: Primary Care | Admitting: Primary Care

## 2020-05-23 ENCOUNTER — Other Ambulatory Visit: Payer: Self-pay

## 2020-05-23 DIAGNOSIS — Z1231 Encounter for screening mammogram for malignant neoplasm of breast: Secondary | ICD-10-CM

## 2020-05-23 DIAGNOSIS — E559 Vitamin D deficiency, unspecified: Secondary | ICD-10-CM

## 2020-06-07 ENCOUNTER — Other Ambulatory Visit (INDEPENDENT_AMBULATORY_CARE_PROVIDER_SITE_OTHER): Payer: BC Managed Care – PPO

## 2020-06-07 DIAGNOSIS — E559 Vitamin D deficiency, unspecified: Secondary | ICD-10-CM | POA: Diagnosis not present

## 2020-06-07 LAB — VITAMIN D 25 HYDROXY (VIT D DEFICIENCY, FRACTURES): VITD: 40.34 ng/mL (ref 30.00–100.00)

## 2020-06-14 DIAGNOSIS — Z20822 Contact with and (suspected) exposure to covid-19: Secondary | ICD-10-CM | POA: Diagnosis not present

## 2020-06-15 ENCOUNTER — Other Ambulatory Visit: Payer: Self-pay | Admitting: Primary Care

## 2020-06-15 DIAGNOSIS — G43901 Migraine, unspecified, not intractable, with status migrainosus: Secondary | ICD-10-CM

## 2020-07-18 ENCOUNTER — Telehealth: Payer: Self-pay | Admitting: *Deleted

## 2020-07-18 NOTE — Telephone Encounter (Signed)
Patient left a voicemail stating that she has been trying to get a refill on her Topamax for 2 weeks from [facility]. [facility] stated that [facility] told her they do not have a prescriptions for her and she should contact her doctor's office.  Chart shows that script was [facility] to [facility] on [facility]20 #90/1 refill and it shows that they received it. Called and spoke to [place] at [facility] and was advised they did not receive the script. Prescription given to [facility] at [facility] and she took the information. Called patient and advised her that [facility] should have the prescription for her now.

## 2020-09-11 ENCOUNTER — Other Ambulatory Visit: Payer: Self-pay | Admitting: Primary Care

## 2020-09-11 DIAGNOSIS — E559 Vitamin D deficiency, unspecified: Secondary | ICD-10-CM

## 2020-10-14 ENCOUNTER — Other Ambulatory Visit: Payer: Self-pay | Admitting: Primary Care

## 2020-10-14 DIAGNOSIS — E559 Vitamin D deficiency, unspecified: Secondary | ICD-10-CM

## 2020-11-13 ENCOUNTER — Other Ambulatory Visit: Payer: Self-pay | Admitting: Primary Care

## 2020-11-13 DIAGNOSIS — E559 Vitamin D deficiency, unspecified: Secondary | ICD-10-CM

## 2020-12-02 ENCOUNTER — Other Ambulatory Visit: Payer: Self-pay | Admitting: Primary Care

## 2020-12-02 DIAGNOSIS — E559 Vitamin D deficiency, unspecified: Secondary | ICD-10-CM

## 2020-12-25 ENCOUNTER — Other Ambulatory Visit: Payer: Self-pay | Admitting: Primary Care

## 2020-12-25 DIAGNOSIS — E559 Vitamin D deficiency, unspecified: Secondary | ICD-10-CM

## 2020-12-25 NOTE — Telephone Encounter (Signed)
Want high Vitamin D refilled? No notes saying how long pt was to take and hasn't had Vitamin D checked in over 6 months.

## 2020-12-25 NOTE — Telephone Encounter (Signed)
Recommend she have Vit D repeated prior to refill, ok to order future Vit D and have pt schedule lab only appt. Dx Vit D deficiency

## 2021-01-02 DIAGNOSIS — U071 COVID-19: Secondary | ICD-10-CM | POA: Diagnosis not present

## 2021-01-02 DIAGNOSIS — Z03818 Encounter for observation for suspected exposure to other biological agents ruled out: Secondary | ICD-10-CM | POA: Diagnosis not present

## 2021-01-02 DIAGNOSIS — Z20822 Contact with and (suspected) exposure to covid-19: Secondary | ICD-10-CM | POA: Diagnosis not present

## 2021-01-24 ENCOUNTER — Other Ambulatory Visit: Payer: Self-pay | Admitting: Primary Care

## 2021-01-24 DIAGNOSIS — G43901 Migraine, unspecified, not intractable, with status migrainosus: Secondary | ICD-10-CM

## 2021-04-08 ENCOUNTER — Other Ambulatory Visit: Payer: Self-pay | Admitting: Primary Care

## 2021-04-08 DIAGNOSIS — G43901 Migraine, unspecified, not intractable, with status migrainosus: Secondary | ICD-10-CM

## 2021-04-08 DIAGNOSIS — I10 Essential (primary) hypertension: Secondary | ICD-10-CM

## 2021-04-08 NOTE — Telephone Encounter (Signed)
Patient is overdue for CPE and follow up, must have this scheduled for any further refills. Please schedule. I will send 30 day supply for each medication.

## 2021-04-10 NOTE — Telephone Encounter (Signed)
Please call patient and schedule appointment as instructed. 

## 2021-04-17 NOTE — Telephone Encounter (Signed)
lvm asking pt to call office to schedule

## 2021-05-01 NOTE — Telephone Encounter (Signed)
Pt scheduled on 6/23

## 2021-05-06 ENCOUNTER — Other Ambulatory Visit: Payer: Self-pay | Admitting: Primary Care

## 2021-05-06 DIAGNOSIS — I10 Essential (primary) hypertension: Secondary | ICD-10-CM

## 2021-05-30 ENCOUNTER — Telehealth: Payer: Self-pay

## 2021-05-30 DIAGNOSIS — I10 Essential (primary) hypertension: Secondary | ICD-10-CM

## 2021-05-30 DIAGNOSIS — G43901 Migraine, unspecified, not intractable, with status migrainosus: Secondary | ICD-10-CM

## 2021-05-30 NOTE — Telephone Encounter (Signed)
Patient called and stated that her insurance company is now requiring a 90 day supply of her medications in order for them to cover it.

## 2021-05-31 MED ORDER — HYDROCHLOROTHIAZIDE 25 MG PO TABS: ORAL_TABLET | ORAL | 0 refills | Status: DC

## 2021-05-31 MED ORDER — TOPIRAMATE 50 MG PO TABS: 50.0000 mg | ORAL_TABLET | Freq: Every day | ORAL | 0 refills | Status: DC

## 2021-05-31 NOTE — Telephone Encounter (Signed)
Noted, will send in Rx for HCTZ and Topamax to pharmacy for 90 day supply. We will see her in a few weeks.

## 2021-05-31 NOTE — Telephone Encounter (Signed)
Called patient has appointment for CPE on 6/23. She has been out of bp meds for over 3 weeks. Insurance will not fill anything for 30 days needs everything sent in for 90 days. Can you send in refills to   [facility] on [address].

## 2021-05-31 NOTE — Addendum Note (Signed)
Addended by: Pleas Koch on: 05/31/2021 02:33 PM   Modules accepted: Orders

## 2021-06-20 ENCOUNTER — Encounter: Payer: Self-pay | Admitting: Primary Care

## 2021-06-20 ENCOUNTER — Other Ambulatory Visit: Payer: Self-pay

## 2021-06-20 ENCOUNTER — Ambulatory Visit (INDEPENDENT_AMBULATORY_CARE_PROVIDER_SITE_OTHER): Payer: BC Managed Care – PPO | Admitting: Primary Care

## 2021-06-20 VITALS — BP 122/82 | HR 72 | Temp 97.9°F | Ht 62.5 in | Wt 206.0 lb

## 2021-06-20 DIAGNOSIS — E785 Hyperlipidemia, unspecified: Secondary | ICD-10-CM | POA: Diagnosis not present

## 2021-06-20 DIAGNOSIS — Z114 Encounter for screening for human immunodeficiency virus [HIV]: Secondary | ICD-10-CM

## 2021-06-20 DIAGNOSIS — G43909 Migraine, unspecified, not intractable, without status migrainosus: Secondary | ICD-10-CM

## 2021-06-20 DIAGNOSIS — N924 Excessive bleeding in the premenopausal period: Secondary | ICD-10-CM | POA: Diagnosis not present

## 2021-06-20 DIAGNOSIS — I10 Essential (primary) hypertension: Secondary | ICD-10-CM

## 2021-06-20 DIAGNOSIS — E559 Vitamin D deficiency, unspecified: Secondary | ICD-10-CM

## 2021-06-20 DIAGNOSIS — Z124 Encounter for screening for malignant neoplasm of cervix: Secondary | ICD-10-CM | POA: Diagnosis not present

## 2021-06-20 DIAGNOSIS — F419 Anxiety disorder, unspecified: Secondary | ICD-10-CM

## 2021-06-20 DIAGNOSIS — Z0001 Encounter for general adult medical examination with abnormal findings: Secondary | ICD-10-CM

## 2021-06-20 DIAGNOSIS — Z1159 Encounter for screening for other viral diseases: Secondary | ICD-10-CM | POA: Diagnosis not present

## 2021-06-20 DIAGNOSIS — K5900 Constipation, unspecified: Secondary | ICD-10-CM

## 2021-06-20 LAB — COMPREHENSIVE METABOLIC PANEL
ALT: 9 U/L (ref 0–35)
AST: 15 U/L (ref 0–37)
Albumin: 4.7 g/dL (ref 3.5–5.2)
Alkaline Phosphatase: 68 U/L (ref 39–117)
BUN: 17 mg/dL (ref 6–23)
CO2: 29 mEq/L (ref 19–32)
Calcium: 10.3 mg/dL (ref 8.4–10.5)
Chloride: 101 mEq/L (ref 96–112)
Creatinine, Ser: 0.88 mg/dL (ref 0.40–1.20)
GFR: 75.83 mL/min (ref >60.00)
Glucose, Bld: 89 mg/dL (ref 70–99)
Potassium: 3.8 mEq/L (ref 3.5–5.1)
Sodium: 139 mEq/L (ref 135–145)
Total Bilirubin: 0.5 mg/dL (ref 0.2–1.2)
Total Protein: 8 g/dL (ref 6.0–8.3)

## 2021-06-20 LAB — LIPID PANEL
Cholesterol: 223 mg/dL — ABNORMAL HIGH (ref 0–200)
HDL: 72.9 mg/dL (ref >39.00)
LDL Cholesterol: 132 mg/dL — ABNORMAL HIGH (ref 0–99)
NonHDL: 150.14
Total CHOL/HDL Ratio: 3
Triglycerides: 91 mg/dL (ref 0.0–149.0)
VLDL: 18.2 mg/dL (ref 0.0–40.0)

## 2021-06-20 LAB — CBC
HCT: 42.3 % (ref 36.0–46.0)
Hemoglobin: 14.3 g/dL (ref 12.0–15.0)
MCHC: 33.9 g/dL (ref 30.0–36.0)
MCV: 84.7 fl (ref 78.0–100.0)
Platelets: 305 10*3/uL (ref 150.0–400.0)
RBC: 4.99 Mil/uL (ref 3.87–5.11)
RDW: 14.3 % (ref 11.5–15.5)
WBC: 5.6 10*3/uL (ref 4.0–10.5)

## 2021-06-20 LAB — VITAMIN D 25 HYDROXY (VIT D DEFICIENCY, FRACTURES): VITD: 27.63 ng/mL — ABNORMAL LOW (ref 30.00–100.00)

## 2021-06-20 NOTE — Patient Instructions (Addendum)
Stop by the lab prior to leaving today. I will notify you of your results once received.   You will be contacted regarding your ultrasound.  Please let us know if you have not been contacted within two weeks.   It was a pleasure to see you today!  Preventive Care 51-52 Years Old, Female Preventive care refers to lifestyle choices and visits with your health care provider that can promote health and wellness. This includes: A yearly physical exam. This is also called an annual wellness visit. Regular dental and eye exams. Immunizations. Screening for certain conditions. Healthy lifestyle choices, such as: Eating a healthy diet. Getting regular exercise. Not using drugs or products that contain nicotine and tobacco. Limiting alcohol use. What can I expect for my preventive care visit? Physical exam Your health care provider will check your: Height and weight. These may be used to calculate your BMI (body mass index). BMI is a measurement that tells if you are at a healthy weight. Heart rate and blood pressure. Body temperature. Skin for abnormal spots. Counseling Your health care provider may ask you questions about your: Past medical problems. Family's medical history. Alcohol, tobacco, and drug use. Emotional well-being. Home life and relationship well-being. Sexual activity. Diet, exercise, and sleep habits. Work and work Statistician. Access to firearms. Method of birth control. Menstrual cycle. Pregnancy history. What immunizations do I need?  Vaccines are usually given at various ages, according to a schedule. Your health care provider will recommend vaccines for you based on your age, medicalhistory, and lifestyle or other factors, such as travel or where you work. What tests do I need? Blood tests Lipid and cholesterol levels. These may be checked every 5 years, or more often if you are over 46 years old. Hepatitis C test. Hepatitis B test. Screening Lung cancer  screening. You may have this screening every year starting at age 35 if you have a 30-pack-year history of smoking and currently smoke or have quit within the past 15 years. Colorectal cancer screening. All adults should have this screening starting at age 28 and continuing until age 28. Your health care provider may recommend screening at age 58 if you are at increased risk. You will have tests every 1-10 years, depending on your results and the type of screening test. Diabetes screening. This is done by checking your blood sugar (glucose) after you have not eaten for a while (fasting). You may have this done every 1-3 years. Mammogram. This may be done every 1-2 years. Talk with your health care provider about when you should start having regular mammograms. This may depend on whether you have a family history of breast cancer. BRCA-related cancer screening. This may be done if you have a family history of breast, ovarian, tubal, or peritoneal cancers. Pelvic exam and Pap test. This may be done every 3 years starting at age 50. Starting at age 64, this may be done every 5 years if you have a Pap test in combination with an HPV test. Other tests STD (sexually transmitted disease) testing, if you are at risk. Bone density scan. This is done to screen for osteoporosis. You may have this scan if you are at high risk for osteoporosis. Talk with your health care provider about your test results, treatment options,and if necessary, the need for more tests. Follow these instructions at home: Eating and drinking  Eat a diet that includes fresh fruits and vegetables, whole grains, lean protein, and low-fat dairy products. Take vitamin and  mineral supplements as recommended by your health care provider. Do not drink alcohol if: Your health care provider tells you not to drink. You are pregnant, may be pregnant, or are planning to become pregnant. If you drink alcohol: Limit how much you have to  0-1 drink a day. Be aware of how much alcohol is in your drink. In the [place] one drink equals one 12 oz bottle of beer (355 mL), one 5 oz glass of wine (148 mL), or one 1 oz glass of hard liquor (44 mL).  Lifestyle Take daily care of your teeth and gums. Brush your teeth every morning and night with fluoride toothpaste. Floss one time each day. Stay active. Exercise for at least 30 minutes 5 or more days each week. Do not use any products that contain nicotine or tobacco, such as cigarettes, e-cigarettes, and chewing tobacco. If you need help quitting, ask your health care provider. Do not use drugs. If you are sexually active, practice safe sex. Use a condom or other form of protection to prevent STIs (sexually transmitted infections). If you do not wish to become pregnant, use a form of birth control. If you plan to become pregnant, see your health care provider for a prepregnancy visit. If told by your health care provider, take low-dose aspirin daily starting at age 92. Find healthy ways to cope with stress, such as: Meditation, yoga, or listening to music. Journaling. Talking to a trusted person. Spending time with friends and family. Safety Always wear your seat belt while driving or riding in a vehicle. Do not drive: If you have been drinking alcohol. Do not ride with someone who has been drinking. When you are tired or distracted. While texting. Wear a helmet and other protective equipment during sports activities. If you have firearms in your house, make sure you follow all gun safety procedures. What's next? Visit your health care provider once a year for an annual wellness visit. Ask your health care provider how often you should have your eyes and teeth checked. Stay up to date on all vaccines. This information is not intended to replace advice given to you by your health care provider. Make sure you discuss any questions you have with your healthcare provider. Document  Revised: 09/18/2020 Document Reviewed: 08/26/2018 Elsevier Patient Education  2022 [employer].

## 2021-06-20 NOTE — Assessment & Plan Note (Signed)
Well controlled in the office today HCTZ 25 mg daily.

## 2021-06-20 NOTE — Assessment & Plan Note (Signed)
Discussed the importance of a healthy diet and regular exercise in order for weight loss, and to reduce the risk of further co-morbidity.  Repeat lipid panel pending.

## 2021-06-20 NOTE — Assessment & Plan Note (Signed)
Denies concerns today. Doing well with dietary changes.

## 2021-06-20 NOTE — Assessment & Plan Note (Signed)
Doing well on PRN hydroxyzine 10 mg, continue same.

## 2021-06-20 NOTE — Progress Notes (Signed)
Subjective:    Patient ID: [name], female    DOB: 1971-09-28, 50 y.o.   MRN: [id number]  HPI  [name] is a very pleasant 50 y.o. female who presents today for complete physical.   She would also like to discuss dysmenorrhea which occurs the first or second day of menses, also sometimes heavy with clots. Also with fluctuations in flow. This month not so bad. Symptoms began about 6 months. She denies a family history of uterine/cervical cancer. Mother had to have a hysterectomy years ago due to a problem with her IUD.   Immunizations: -Tetanus: 2017 -Influenza: Due this season  -Covid-19: Completed 3 vaccines -Shingles: Never completed   Diet: Fair diet.  Exercise: No regular exercise.  Eye exam: Completes annually  Dental exam: Completes semi-annually   Pap Smear: Completed in 2019 Mammogram: Completed in May 2021, declines this year Colonoscopy: Completed in 2021, due 2031  BP Readings from Last 3 Encounters:  06/20/21 122/82  04/05/20 129/64  03/06/20 120/84         Review of Systems  Constitutional:  Negative for unexpected weight change.  HENT:  Negative for rhinorrhea.   Eyes:  Negative for visual disturbance.  Respiratory:  Negative for shortness of breath.   Cardiovascular:  Negative for chest pain.  Gastrointestinal:  Negative for constipation and diarrhea.  Genitourinary:  Positive for menstrual problem. Negative for difficulty urinating.  Musculoskeletal:  Negative for arthralgias and myalgias.  Skin:  Negative for rash.  Allergic/Immunologic: Negative for environmental allergies.  Neurological:  Negative for dizziness, numbness and headaches.  Psychiatric/Behavioral:  The patient is not nervous/anxious.         Past Medical History:  Diagnosis Date   Allergy    Anxiety    Headache(784.0)    MIGRAINES   History of chickenpox    History of migraine    Hypertension    Morbid obesity (HCC)    OSA on CPAP    Sleep apnea     no CPAP needed now    Social History   Socioeconomic History   Marital status: Married    Spouse name: Not on file   Number of children: Not on file   Years of education: Not on file   Highest education level: Not on file  Occupational History   Not on file  Tobacco Use   Smoking status: Never   Smokeless tobacco: Never  Vaping Use   Vaping Use: Never used  Substance and Sexual Activity   Alcohol use: Yes    Alcohol/week: 0.0 standard drinks    Comment: occasional glass of wine or mixed drink 2 or 3 times a month   Drug use: No   Sexual activity: Not on file  Other Topics Concern   Not on file  Social History Narrative   Married.   1 child.   Works at [employer].   Enjoys walking, bowling, Nascar, going to the beach.    Social Determinants of Health   Financial Resource Strain: Not on file  Food Insecurity: Not on file  Transportation Needs: Not on file  Physical Activity: Not on file  Stress: Not on file  Social Connections: Not on file  Intimate Partner Violence: Not on file    Past Surgical History:  Procedure Laterality Date   CESAREAN SECTION  05/2005   GASTRIC ROUX-EN-Y N/A 02/27/2014   Procedure: LAPAROSCOPIC ROUX-EN-Y GASTRIC BYPASS WITH UPPER ENDOSCOPY ;  Surgeon: [name], MD;  Location:  [facility];  Service: General;  Laterality: N/A;   THERAPEUTIC ABORTION  1988   TUBAL LIGATION  2011    Family History  Problem Relation Age of Onset   Cancer Mother        lung   Hypertension Mother    Diabetes Mother    Emphysema Father    Heart failure Father    Cancer Maternal Aunt        breast   Colon cancer Neg Hx    Esophageal cancer Neg Hx    Rectal cancer Neg Hx    Stomach cancer Neg Hx     Allergies  Allergen Reactions   Latex     irritation   Lisinopril Hives, Itching and Swelling    Current Outpatient Medications on File Prior to Visit  Medication Sig Dispense Refill   cetirizine (ZYRTEC) 10 MG tablet Take 10 mg by mouth daily.      hydrochlorothiazide (HYDRODIURIL) 25 MG tablet TAKE 1 TABLET BY MOUTH EVERY DAY FOR BLOOD PRESSURE 90 tablet 0   hydrOXYzine (ATARAX/VISTARIL) 10 MG tablet Take 1 tablet (10 mg total) by mouth 2 (two) times daily as needed for anxiety. 30 tablet 0   Ibuprofen 200 MG CAPS Take by mouth as needed.     topiramate (TOPAMAX) 50 MG tablet Take 1 tablet (50 mg total) by mouth at bedtime. For headache prevention. 90 tablet 0   Calcium Carbonate-Vitamin D (CALTRATE 600+D PO) Take 1 capsule by mouth 3 (three) times daily. (Patient not taking: Reported on 06/20/2021)     IRON PO Take 65 mg by mouth daily. (Patient not taking: Reported on 06/20/2021)     No current facility-administered medications on file prior to visit.    BP 122/82   Pulse 72   Temp 97.9 F (36.6 C) (Temporal)   Ht 5' 2.5" (1.588 m)   Wt 206 lb (93.4 kg)   SpO2 97%   BMI 37.08 kg/m  Objective:   Physical Exam HENT:     Right Ear: Tympanic membrane and ear canal normal.     Left Ear: Tympanic membrane and ear canal normal.     Nose: Nose normal.  Eyes:     Conjunctiva/sclera: Conjunctivae normal.     Pupils: Pupils are equal, round, and reactive to light.  Neck:     Thyroid: No thyromegaly.  Cardiovascular:     Rate and Rhythm: Normal rate and regular rhythm.     Heart sounds: No murmur heard. Pulmonary:     Effort: Pulmonary effort is normal.     Breath sounds: Normal breath sounds. No rales.  Abdominal:     General: Bowel sounds are normal.     Palpations: Abdomen is soft.     Tenderness: There is no abdominal tenderness.  Musculoskeletal:        General: Normal range of motion.     Cervical back: Neck supple.  Lymphadenopathy:     Cervical: No cervical adenopathy.  Skin:    General: Skin is warm and dry.     Findings: No rash.  Neurological:     Mental Status: She is alert and oriented to person, place, and time.     Cranial Nerves: No cranial nerve deficit.     Deep Tendon Reflexes: Reflexes are normal and  symmetric.  Psychiatric:        Mood and Affect: Mood normal.          Assessment & Plan:      This  visit occurred during the SARS-CoV-2 public health emergency.  Safety protocols were in place, including screening questions prior to the visit, additional usage of staff PPE, and extensive cleaning of exam room while observing appropriate contact time as indicated for disinfecting solutions.

## 2021-06-20 NOTE — Assessment & Plan Note (Signed)
Chronic for 5-6 months, heavy clotting with regular cycles. Suspect perimenopause but will rule out any other abnormalities.   Pelvic and transvaginal ultrasound ordered and pending. Checking CBC.

## 2021-06-20 NOTE — Assessment & Plan Note (Addendum)
Shingrix due, declines today.  Pap smear due December 2022, will defer until next year. Mammogram deferred until next year per patient request.  Colonoscopy UTD, due in 2031.  Discussed the importance of a healthy diet and regular exercise in order for weight loss, and to reduce the risk of further co-morbidity.  Exam today stable. Labs pending.

## 2021-06-20 NOTE — Assessment & Plan Note (Signed)
Not taking any vitamin D or calcium.  Repeat level pending.

## 2021-06-20 NOTE — Assessment & Plan Note (Signed)
Doing well on Topamax 50 mg HS for migraine prevention. Continue same.

## 2021-06-21 LAB — HEPATITIS C ANTIBODY
Hepatitis C Ab: NONREACTIVE
SIGNAL TO CUT-OFF: 0 (ref <1.00)

## 2021-06-21 LAB — HIV ANTIBODY (ROUTINE TESTING W REFLEX): HIV 1&2 Ab, 4th Generation: NONREACTIVE

## 2021-06-24 ENCOUNTER — Telehealth: Payer: Self-pay

## 2021-06-24 NOTE — Telephone Encounter (Signed)
Pt called in and left a message in regards to her cholesterol and the [employer] from [place]. She says that she see's her cholesterol is high and that Kate's feedback was to follow a healthy diet and increase exercise. Pt states she would like more advise on what her diet should consist of. Please advise.

## 2021-06-25 NOTE — Telephone Encounter (Signed)
A healthy diet consists of fresh fruits and vegetables, lean protein, whole grains.  Limiting junk food, fried food, fast food, sodas, sweet tea.  Ensure you are consuming 64 ounces of water daily.  You should be getting 150 minutes of moderate intensity exercise weekly.  We can repeat her cholesterol again in 6 months if she would like, if that is the case then she can set up a lab appointment for that time.  4-hour fast, can have water.

## 2021-06-26 NOTE — Telephone Encounter (Signed)
Called patient all information given. Per patient request I have sent in my chart as well. She has f/u in 6 months and will come fasting for labs. I have made note in appointment for reminder.

## 2021-06-28 ENCOUNTER — Telehealth: Payer: Self-pay

## 2021-06-28 NOTE — Telephone Encounter (Signed)
Called patient to relay [place] appointment information, not able to leave a message due to voicemail box been full. Sent mychart message also.  Patient is scheduled at [facility] on 07/15/21 at 11 am, to arrive at 10:45 am to check in. Patient will need to have a full bladder and not void 30 minutes to an hour prior to her appointment. Address: [address], [place], [place] [address]  If need to reschedule patient can call directly to [phone], option 3 and then option 2.

## 2021-07-15 ENCOUNTER — Ambulatory Visit
Admission: RE | Admit: 2021-07-15 | Discharge: 2021-07-15 | Disposition: A | Discharge status: Home / Self Care | Payer: BC Managed Care – PPO | Source: Ambulatory Visit | Attending: Primary Care | Admitting: Primary Care

## 2021-07-15 ENCOUNTER — Other Ambulatory Visit: Payer: Self-pay

## 2021-07-15 DIAGNOSIS — N920 Excessive and frequent menstruation with regular cycle: Secondary | ICD-10-CM | POA: Diagnosis not present

## 2021-07-15 DIAGNOSIS — N924 Excessive bleeding in the premenopausal period: Secondary | ICD-10-CM | POA: Insufficient documentation

## 2021-07-15 DIAGNOSIS — N939 Abnormal uterine and vaginal bleeding, unspecified: Secondary | ICD-10-CM | POA: Diagnosis not present

## 2021-07-15 DIAGNOSIS — Z9851 Tubal ligation status: Secondary | ICD-10-CM | POA: Diagnosis not present

## 2021-07-15 DIAGNOSIS — N946 Dysmenorrhea, unspecified: Secondary | ICD-10-CM | POA: Diagnosis not present

## 2021-07-15 IMAGING — US US PELVIS COMPLETE WITH TRANSVAGINAL
1 series · 13 of 25 positions shown · non-contrast
Comparison: None

CLINICAL DATA: Heavy vaginal bleeding during menses with clots,
dysmenorrhea, history of Caesarean section, tubal ligation; LMP in
[REDACTED], date not provided



[Series 1: us pelvis complete with transvaginal · 0.21mm/px · 13 of 72 slices shown]
[im 1/72]
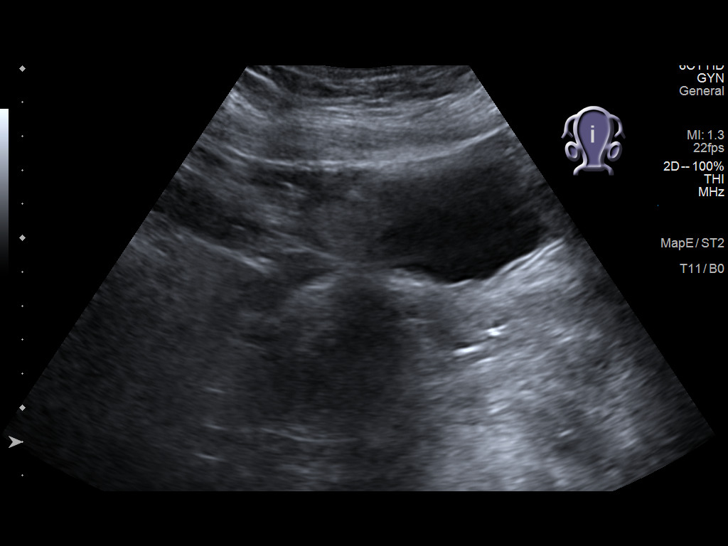
[im 6/72]
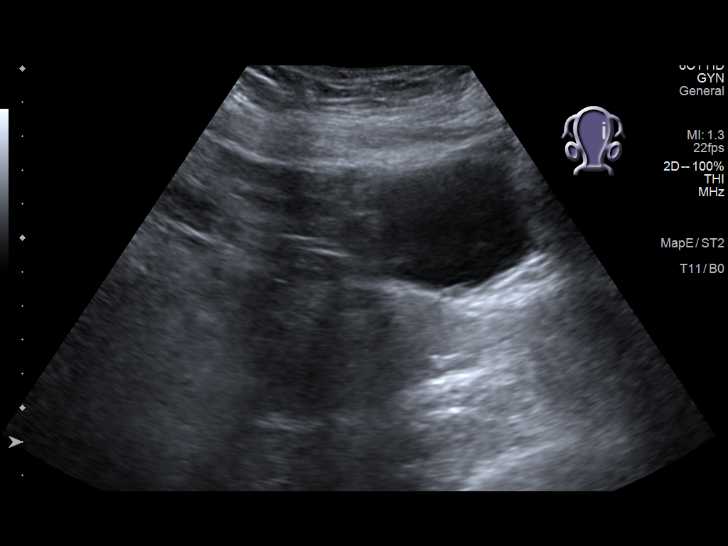
[im 12/72]
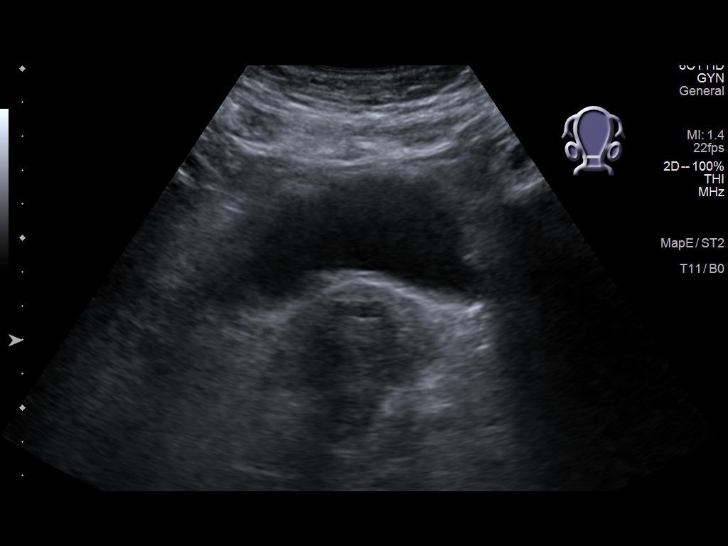
[im 18/72]
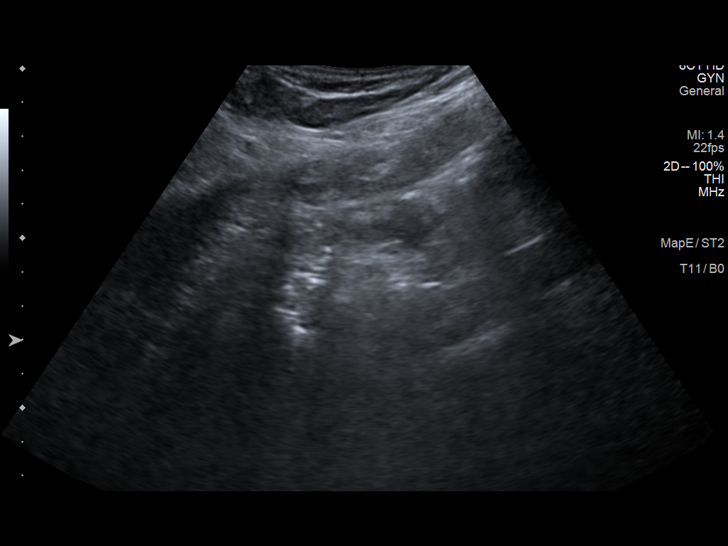
[im 24/72]
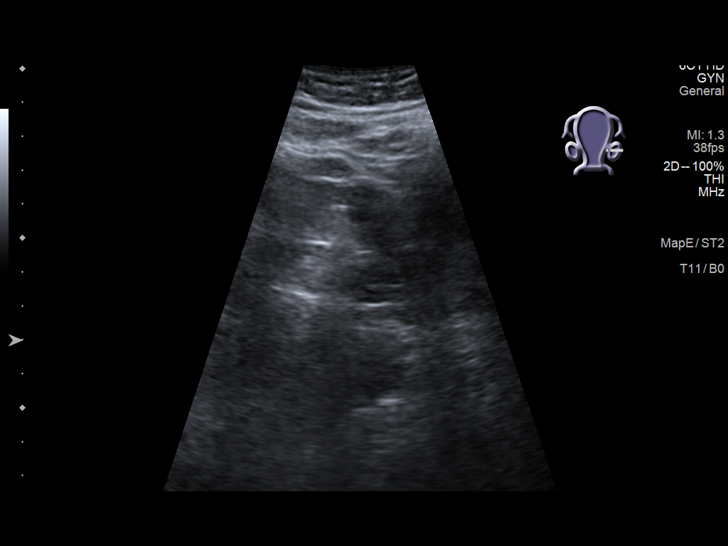
[im 30/72]
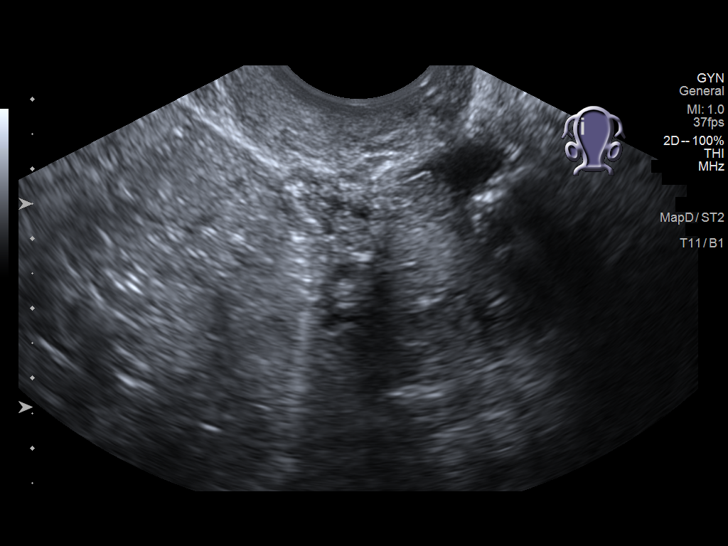
[im 36/72]
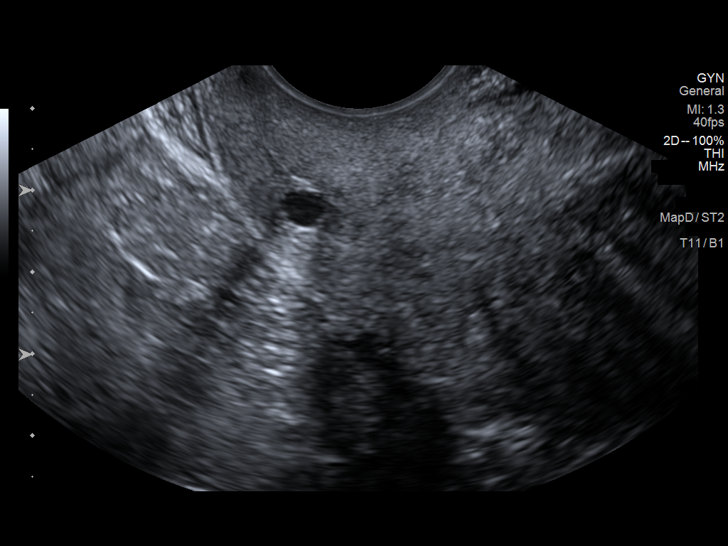
[im 42/72]
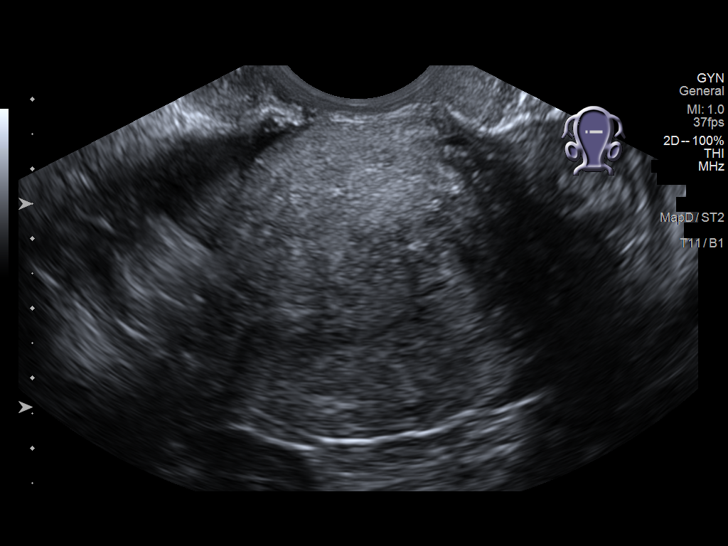
[im 48/72]
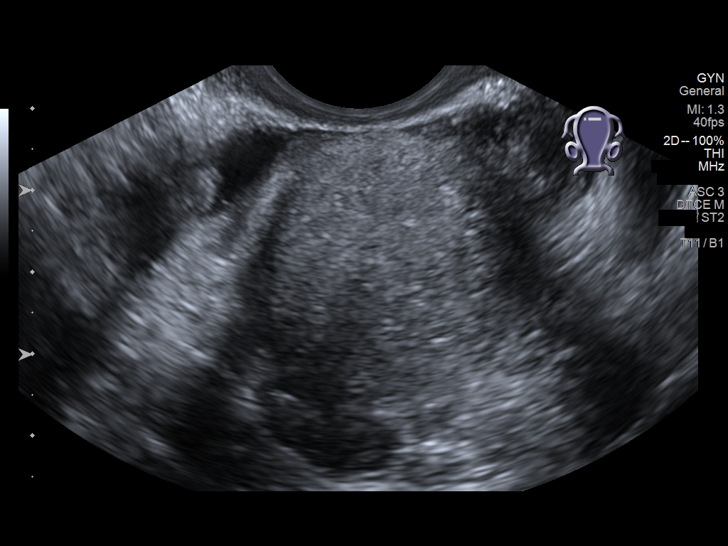
[im 54/72]
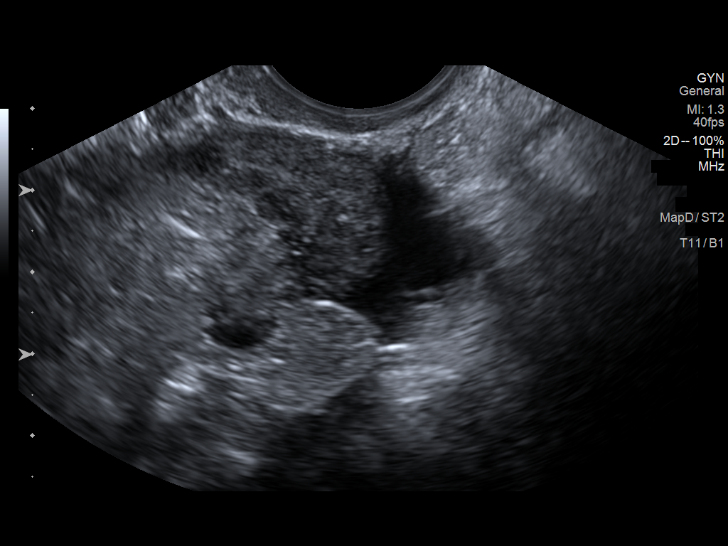
[im 60/72]
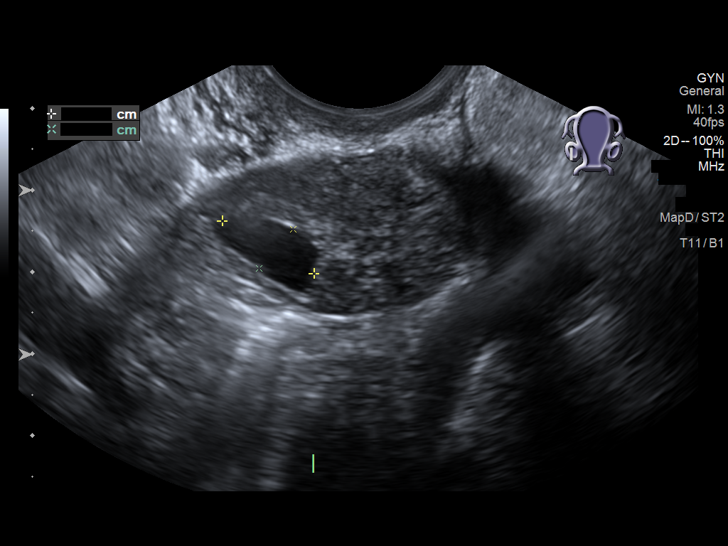
[im 66/72]
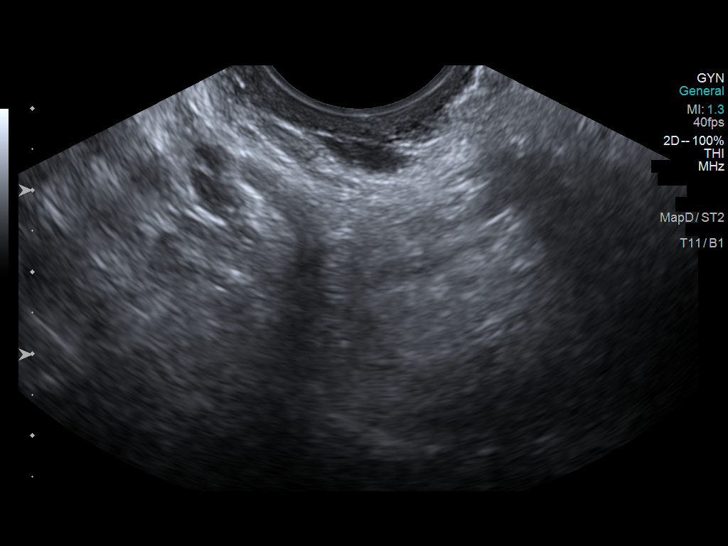
[im 72/72]
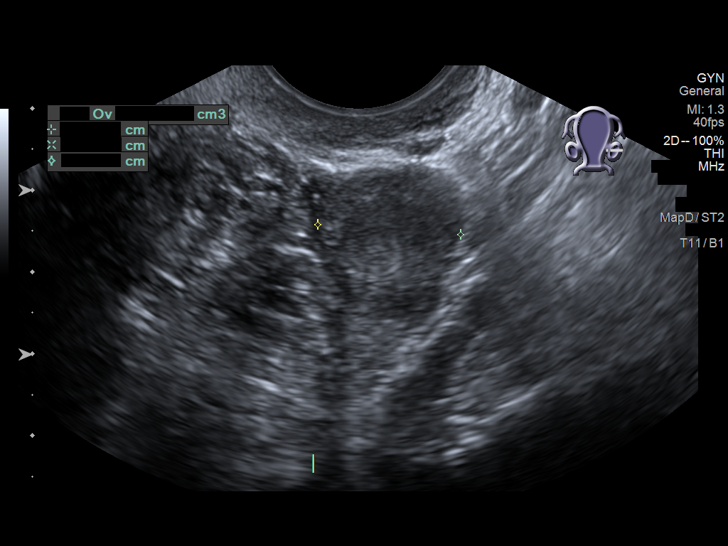

[13 of 25 positions shown; findings below may reference images not displayed]

FINDINGS: Uterus

Measurements: 6.8 x 4.2 x 4.5 cm = volume: 67 mL. Retroverted.
Ill-defined intramural leiomyoma posteriorly 16 mm diameter. No
additional masses

Endometrium

Thickness: 3 mm.  No endometrial fluid or focal abnormality

Right ovary

Measurements: 3.2 x 1.9 x 2.0 cm = volume: 6.4 mL. Normal morphology
without mass

Left ovary

Measurements: 2.8 x 1.7 x 1.8 cm = volume: 4.4 mL. Normal morphology
without mass

Other findings

Trace free pelvic fluid.  No adnexal masses.
IMPRESSION: 16 mm intramural leiomyoma posteriorly within uterus.

Otherwise normal exam.

## 2021-07-18 DIAGNOSIS — N924 Excessive bleeding in the premenopausal period: Secondary | ICD-10-CM

## 2021-07-18 NOTE — Telephone Encounter (Signed)
Ok to do referral?

## 2021-08-06 DIAGNOSIS — Z124 Encounter for screening for malignant neoplasm of cervix: Secondary | ICD-10-CM | POA: Diagnosis not present

## 2021-08-06 DIAGNOSIS — N924 Excessive bleeding in the premenopausal period: Secondary | ICD-10-CM | POA: Diagnosis not present

## 2021-10-02 ENCOUNTER — Other Ambulatory Visit: Payer: Self-pay | Admitting: Primary Care

## 2021-10-02 DIAGNOSIS — G43901 Migraine, unspecified, not intractable, with status migrainosus: Secondary | ICD-10-CM

## 2021-10-02 DIAGNOSIS — I10 Essential (primary) hypertension: Secondary | ICD-10-CM

## 2021-10-02 NOTE — Telephone Encounter (Signed)
Pt called stating she needs Rx's for 90 day supply instead of 30 so her insurance will cover.

## 2022-01-01 ENCOUNTER — Ambulatory Visit: Payer: BC Managed Care – PPO | Admitting: Primary Care

## 2022-01-01 ENCOUNTER — Other Ambulatory Visit: Payer: Self-pay

## 2022-01-01 ENCOUNTER — Encounter: Payer: Self-pay | Admitting: Primary Care

## 2022-01-01 VITALS — BP 124/76 | HR 77 | Temp 97.0°F | Ht 62.5 in | Wt 208.0 lb

## 2022-01-01 DIAGNOSIS — I10 Essential (primary) hypertension: Secondary | ICD-10-CM

## 2022-01-01 DIAGNOSIS — F419 Anxiety disorder, unspecified: Secondary | ICD-10-CM | POA: Diagnosis not present

## 2022-01-01 DIAGNOSIS — Z23 Encounter for immunization: Secondary | ICD-10-CM

## 2022-01-01 DIAGNOSIS — F411 Generalized anxiety disorder: Secondary | ICD-10-CM

## 2022-01-01 MED ORDER — HYDROXYZINE HCL 10 MG PO TABS: 10.0000 mg | ORAL_TABLET | Freq: Two times a day (BID) | ORAL | 0 refills | Status: AC | PRN

## 2022-01-01 MED ORDER — SERTRALINE HCL 50 MG PO TABS: 50.0000 mg | ORAL_TABLET | Freq: Every day | ORAL | 0 refills | Status: DC

## 2022-01-01 NOTE — Patient Instructions (Signed)
Start sertraline (Zoloft) 50 mg for anxiety and depression. Take 1/2 tablet by mouth once daily for about one week, then increase to 1 full tablet thereafter.   Please contact me if you experience any problems when starting this medication or with the dose increase to 1 full tablet. Common side effects should abate within 1-2 weeks.   You will be contacted regarding your referral to therapy.  Please let us know if you have not been contacted within two weeks.   We will see you in 6 weeks!  It was a pleasure to see you today!

## 2022-01-01 NOTE — Assessment & Plan Note (Addendum)
Deteriorated given added stress.  Discussed options for treatment, she elects for medication and therapy. Rx for Zoloft 50 mg sent to pharmacy.   Patient is to take 1/2 tablet daily for 8 days, then advance to 1 full tablet thereafter. We discussed possible side effects of headache, GI upset, drowsiness, and SI/HI. If thoughts of SI/HI develop, we discussed to present to the emergency immediately. Patient verbalized understanding.   Refill provided for hydroxyzine to use PRN.  Follow up in 6 weeks for re-evaluation.

## 2022-01-01 NOTE — Progress Notes (Signed)
Subjective:    Patient ID: [name], female    DOB: 24-May-1971, 51 y.o.   MRN: [id number]  HPI  [name] is a very pleasant 51 y.o. female with a history of hypertension, migraines, hyperlipidemia, anxiety, constipation, abnormal uterine bleeding who presents today to discuss anxiety.  Chronic anxiety symptoms for years, is managed on hydroxyzine 25 mg PRN, has been taking intermittently with improvement overall.   Has been doing well overall until over the last year. She's under stress with her mother's declining health, has been stressed with her son moving to college past year, stressed with her older stepson who needs to move out of her house, her sister in law is in and out of her home.   Symptoms include panic attack (last week), feeling anxious, feeling overwhelmed, trouble relaxing, irritability.   BP Readings from Last 3 Encounters:  01/01/22 124/76  06/20/21 122/82  04/05/20 129/64    GAD 7 : Generalized Anxiety Score 01/01/2022  Nervous, Anxious, on Edge 3  Control/stop worrying 3  Worry too much - different things 3  Trouble relaxing 3  Restless 3  Easily annoyed or irritable 3  Afraid - awful might happen 3  Total GAD 7 Score 21  Anxiety Difficulty Very difficult      Review of Systems  Respiratory:  Negative for shortness of breath.   Cardiovascular:  Negative for chest pain.  Psychiatric/Behavioral:  The patient is not nervous/anxious.         Past Medical History:  Diagnosis Date   Allergy    Anxiety    Headache(784.0)    MIGRAINES   History of chickenpox    History of migraine    Hypertension    Morbid obesity (HCC)    OSA on CPAP    Sleep apnea    no CPAP needed now    Social History   Socioeconomic History   Marital status: Married    Spouse name: Not on file   Number of children: Not on file   Years of education: Not on file   Highest education level: Not on file  Occupational History   Not on file  Tobacco Use    Smoking status: Never   Smokeless tobacco: Never  Vaping Use   Vaping Use: Never used  Substance and Sexual Activity   Alcohol use: Yes    Alcohol/week: 0.0 standard drinks    Comment: occasional glass of wine or mixed drink 2 or 3 times a month   Drug use: No   Sexual activity: Not on file  Other Topics Concern   Not on file  Social History Narrative   Married.   1 child.   Works at [employer].   Enjoys walking, bowling, Nascar, going to the beach.    Social Determinants of Health   Financial Resource Strain: Not on file  Food Insecurity: Not on file  Transportation Needs: Not on file  Physical Activity: Not on file  Stress: Not on file  Social Connections: Not on file  Intimate Partner Violence: Not on file    Past Surgical History:  Procedure Laterality Date   CESAREAN SECTION  05/2005   GASTRIC ROUX-EN-Y N/A 02/27/2014   Procedure: LAPAROSCOPIC ROUX-EN-Y GASTRIC BYPASS WITH UPPER ENDOSCOPY ;  Surgeon: [name], MD;  Location: [facility];  Service: General;  Laterality: N/A;   THERAPEUTIC ABORTION  1988   TUBAL LIGATION  2011    Family History  Problem Relation Age of  Onset   Cancer Mother        lung   Hypertension Mother    Diabetes Mother    Emphysema Father    Heart failure Father    Cancer Maternal Aunt        breast   Colon cancer Neg Hx    Esophageal cancer Neg Hx    Rectal cancer Neg Hx    Stomach cancer Neg Hx     Allergies  Allergen Reactions   Latex     irritation   Lisinopril Hives, Itching and Swelling    Current Outpatient Medications on File Prior to Visit  Medication Sig Dispense Refill   Calcium Carbonate-Vitamin D (CALTRATE 600+D PO) Take 1 capsule by mouth 3 (three) times daily.     cetirizine (ZYRTEC) 10 MG tablet Take 10 mg by mouth daily.     hydrochlorothiazide (HYDRODIURIL) 25 MG tablet TAKE 1 TABLET BY MOUTH EVERY DAY FOR BLOOD PRESSURE 90 tablet 2   hydrOXYzine (ATARAX/VISTARIL) 10 MG tablet Take 1 tablet (10 mg total) by  mouth 2 (two) times daily as needed for anxiety. 30 tablet 0   Ibuprofen 200 MG CAPS Take by mouth as needed.     IRON PO Take 65 mg by mouth daily.     topiramate (TOPAMAX) 50 MG tablet TAKE 1 TABLET(50 MG) BY MOUTH AT BEDTIME FOR HEADACHE PREVENTION 90 tablet 2   No current facility-administered medications on file prior to visit.    BP 124/76    Pulse 77    Temp (!) 97 F (36.1 C) (Temporal)    Ht 5' 2.5" (1.588 m)    Wt 208 lb (94.3 kg)    SpO2 100%    BMI 37.44 kg/m  Objective:   Physical Exam Cardiovascular:     Rate and Rhythm: Normal rate and regular rhythm.  Pulmonary:     Effort: Pulmonary effort is normal.     Breath sounds: Normal breath sounds.  Musculoskeletal:     Cervical back: Neck supple.  Skin:    General: Skin is warm and dry.  Psychiatric:        Mood and Affect: Mood normal.          Assessment & Plan:      This visit occurred during the SARS-CoV-2 public health emergency.  Safety protocols were in place, including screening questions prior to the visit, additional usage of staff PPE, and extensive cleaning of exam room while observing appropriate contact time as indicated for disinfecting solutions.

## 2022-02-12 ENCOUNTER — Telehealth (INDEPENDENT_AMBULATORY_CARE_PROVIDER_SITE_OTHER): Payer: BC Managed Care – PPO | Admitting: Primary Care

## 2022-02-12 ENCOUNTER — Other Ambulatory Visit: Payer: Self-pay

## 2022-02-12 ENCOUNTER — Encounter: Payer: Self-pay | Admitting: Primary Care

## 2022-02-12 DIAGNOSIS — F411 Generalized anxiety disorder: Secondary | ICD-10-CM | POA: Diagnosis not present

## 2022-02-12 NOTE — Patient Instructions (Signed)
Continue sertraline 25 mg daily for anxiety. Message me via MyChart when you are ready for me to send in the 25 mg tablet.   Connect with the therapist as discussed.   It was a pleasure to see you today!

## 2022-02-12 NOTE — Progress Notes (Signed)
Patient ID: [name], female    DOB: August 28, 1971, 51 y.o.   MRN: [id number]  Virtual visit completed through [employer], a video enabled telemedicine application. Due to national recommendations of social distancing due to COVID-19, a virtual visit is felt to be most appropriate for this patient at this time. Reviewed limitations, risks, security and privacy concerns of performing a virtual visit and the availability of in person appointments. I also reviewed that there may be a patient responsible charge related to this service. The patient agreed to proceed.   Patient location: home Provider location: [facility], office Persons participating in this virtual visit: patient, provider   If any vitals were documented, they were collected by patient at home unless specified below.    Ht 5' 2"  (1.575 m)    Wt 204 lb (92.5 kg)    BMI 37.31 kg/m    CC: Follow up for anxiety and depression  Subjective:   HPI: [name] is a 51 y.o. female presenting on 02/12/2022 for Anxiety (Has had improvement of symptoms )  She was last evaluated on 01/01/22 for chronic anxiety symptoms that began years prior. She was managed on hydroxyzine 25 mg PRN with improvement, but symptoms of anxiety became more frequent over the last year. During this visit she was treated with Zoloft 50 mg and asked to follow up.  Since her last visit she's been taking sertraline 25 mg as the 50 mg dose caused little motivation do get out of bed or do anything.   She's doing better on the sertraline 25 mg. Positive effects include ability to manage stressful situations better, especially when caring for her mother. She feels less anger/irritability. She did have a few days of a headache initially, but otherwise no other side effects.   She has connected with therapy, but has yet to fill out the paperwork to get something scheduled. She plans on doing this today.      Relevant past medical, surgical,  family and social history reviewed and updated as indicated. Interim medical history since our last visit reviewed. Allergies and medications reviewed and updated. Outpatient Medications Prior to Visit  Medication Sig Dispense Refill   Calcium Carbonate-Vitamin D (CALTRATE 600+D PO) Take 1 capsule by mouth 3 (three) times daily.     cetirizine (ZYRTEC) 10 MG tablet Take 10 mg by mouth daily.     hydrochlorothiazide (HYDRODIURIL) 25 MG tablet TAKE 1 TABLET BY MOUTH EVERY DAY FOR BLOOD PRESSURE 90 tablet 2   hydrOXYzine (ATARAX) 10 MG tablet Take 1 tablet (10 mg total) by mouth 2 (two) times daily as needed for anxiety. 30 tablet 0   Ibuprofen 200 MG CAPS Take by mouth as needed.     IRON PO Take 65 mg by mouth daily.     sertraline (ZOLOFT) 50 MG tablet Take 1 tablet (50 mg total) by mouth daily. For anxiety. 60 tablet 0   topiramate (TOPAMAX) 50 MG tablet TAKE 1 TABLET(50 MG) BY MOUTH AT BEDTIME FOR HEADACHE PREVENTION 90 tablet 2   No facility-administered medications prior to visit.     Per HPI unless specifically indicated in ROS section below Review of Systems  Gastrointestinal:  Negative for diarrhea and nausea.  Neurological:  Negative for headaches.  Psychiatric/Behavioral:  Negative for suicidal ideas. The patient is nervous/anxious.        See HPI  Objective:  Ht 5' 2"  (1.575 m)    Wt 204 lb (92.5 kg)  BMI 37.31 kg/m   Wt Readings from Last 3 Encounters:  02/12/22 204 lb (92.5 kg)  01/01/22 208 lb (94.3 kg)  06/20/21 206 lb (93.4 kg)       Physical exam: General: Alert and oriented x 3, no distress, does not appear sickly  Pulmonary: Speaks in complete sentences without increased work of breathing, no cough during visit.  Psychiatric: Normal mood, thought content, and behavior.     Results for orders placed or performed in visit on 06/20/21  HIV Antibody (routine testing w rflx)  Result Value Ref Range   HIV 1&2 Ab, 4th Generation NON-REACTIVE NON-REACTIVE   Hepatitis C antibody  Result Value Ref Range   Hepatitis C Ab NON-REACTIVE NON-REACTIVE   SIGNAL TO CUT-OFF 0.00 <1.00  Lipid panel  Result Value Ref Range   Cholesterol 223 (H) 0 - 200 mg/dL   Triglycerides 91.0 0.0 - 149.0 mg/dL   HDL 72.90 >39.00 mg/dL   VLDL 18.2 0.0 - 40.0 mg/dL   LDL Cholesterol 132 (H) 0 - 99 mg/dL   Total CHOL/HDL Ratio 3    NonHDL 150.14   Comprehensive metabolic panel  Result Value Ref Range   Sodium 139 135 - 145 mEq/L   Potassium 3.8 3.5 - 5.1 mEq/L   Chloride 101 96 - 112 mEq/L   CO2 29 19 - 32 mEq/L   Glucose, Bld 89 70 - 99 mg/dL   BUN 17 6 - 23 mg/dL   Creatinine, Ser 0.88 0.40 - 1.20 mg/dL   Total Bilirubin 0.5 0.2 - 1.2 mg/dL   Alkaline Phosphatase 68 39 - 117 U/L   AST 15 0 - 37 U/L   ALT 9 0 - 35 U/L   Total Protein 8.0 6.0 - 8.3 g/dL   Albumin 4.7 3.5 - 5.2 g/dL   GFR 75.83 >60.00 mL/min   Calcium 10.3 8.4 - 10.5 mg/dL  CBC  Result Value Ref Range   WBC 5.6 4.0 - 10.5 K/uL   RBC 4.99 3.87 - 5.11 Mil/uL   Platelets 305.0 150.0 - 400.0 K/uL   Hemoglobin 14.3 12.0 - 15.0 g/dL   HCT 42.3 36.0 - 46.0 %   MCV 84.7 78.0 - 100.0 fl   MCHC 33.9 30.0 - 36.0 g/dL   RDW 14.3 11.5 - 15.5 %  VITAMIN D 25 Hydroxy (Vit-D Deficiency, Fractures)  Result Value Ref Range   VITD 27.63 (L) 30.00 - 100.00 ng/mL   Assessment & Plan:   Problem List Items Addressed This Visit       Other   GAD (generalized anxiety disorder)    Improved and tolerating 25 mg dose well.  Continue Zoloft 25 mg daily. She will notify me via MyChart when ready for the 25 mg tablet. She has plenty of the 50 mg dose for which she is breaking in half.  She will connect with therapy this week.   Follow up in June.        No orders of the defined types were placed in this encounter.  No orders of the defined types were placed in this encounter.   I discussed the assessment and treatment plan with the patient. The patient was provided an opportunity to ask  questions and all were answered. The patient agreed with the plan and demonstrated an understanding of the instructions. The patient was advised to call back or seek an in-person evaluation if the symptoms worsen or if the condition fails to improve as anticipated.  Follow up plan:  Continue sertraline 25 mg daily for anxiety. Message me via MyChart when you are ready for me to send in the 25 mg tablet.   Connect with the therapist as discussed.   It was a pleasure to see you today!   Pleas Koch, NP

## 2022-02-12 NOTE — Assessment & Plan Note (Signed)
Improved and tolerating 25 mg dose well.  Continue Zoloft 25 mg daily. She will notify me via MyChart when ready for the 25 mg tablet. She has plenty of the 50 mg dose for which she is breaking in half.  She will connect with therapy this week.   Follow up in June.

## 2022-03-06 ENCOUNTER — Other Ambulatory Visit: Payer: Self-pay | Admitting: Primary Care

## 2022-03-06 DIAGNOSIS — F411 Generalized anxiety disorder: Secondary | ICD-10-CM

## 2022-06-11 ENCOUNTER — Other Ambulatory Visit: Payer: Self-pay | Admitting: Primary Care

## 2022-06-11 DIAGNOSIS — I10 Essential (primary) hypertension: Secondary | ICD-10-CM

## 2022-06-25 ENCOUNTER — Ambulatory Visit (INDEPENDENT_AMBULATORY_CARE_PROVIDER_SITE_OTHER): Payer: BC Managed Care – PPO | Admitting: Primary Care

## 2022-06-25 ENCOUNTER — Encounter: Payer: Self-pay | Admitting: Primary Care

## 2022-06-25 VITALS — BP 124/62 | HR 74 | Temp 97.6°F | Ht 62.0 in | Wt 209.0 lb

## 2022-06-25 DIAGNOSIS — E559 Vitamin D deficiency, unspecified: Secondary | ICD-10-CM | POA: Diagnosis not present

## 2022-06-25 DIAGNOSIS — Z0000 Encounter for general adult medical examination without abnormal findings: Secondary | ICD-10-CM | POA: Diagnosis not present

## 2022-06-25 DIAGNOSIS — I10 Essential (primary) hypertension: Secondary | ICD-10-CM | POA: Diagnosis not present

## 2022-06-25 DIAGNOSIS — F411 Generalized anxiety disorder: Secondary | ICD-10-CM

## 2022-06-25 DIAGNOSIS — G43909 Migraine, unspecified, not intractable, without status migrainosus: Secondary | ICD-10-CM | POA: Diagnosis not present

## 2022-06-25 DIAGNOSIS — K5900 Constipation, unspecified: Secondary | ICD-10-CM

## 2022-06-25 DIAGNOSIS — Z1231 Encounter for screening mammogram for malignant neoplasm of breast: Secondary | ICD-10-CM

## 2022-06-25 LAB — LIPID PANEL
Cholesterol: 213 mg/dL — ABNORMAL HIGH (ref 0–200)
HDL: 79.4 mg/dL
LDL Cholesterol: 114 mg/dL — ABNORMAL HIGH (ref 0–99)
NonHDL: 133.51
Total CHOL/HDL Ratio: 3
Triglycerides: 96 mg/dL (ref 0.0–149.0)
VLDL: 19.2 mg/dL (ref 0.0–40.0)

## 2022-06-25 LAB — COMPREHENSIVE METABOLIC PANEL
ALT: 10 U/L (ref 0–35)
AST: 14 U/L (ref 0–37)
Albumin: 4.4 g/dL (ref 3.5–5.2)
Alkaline Phosphatase: 73 U/L (ref 39–117)
BUN: 17 mg/dL (ref 6–23)
CO2: 25 mEq/L (ref 19–32)
Calcium: 9.6 mg/dL (ref 8.4–10.5)
Chloride: 104 mEq/L (ref 96–112)
Creatinine, Ser: 0.94 mg/dL (ref 0.40–1.20)
GFR: 69.57 mL/min (ref >60.00)
Glucose, Bld: 93 mg/dL (ref 70–99)
Potassium: 3.5 mEq/L (ref 3.5–5.1)
Sodium: 138 mEq/L (ref 135–145)
Total Bilirubin: 0.4 mg/dL (ref 0.2–1.2)
Total Protein: 7.4 g/dL (ref 6.0–8.3)

## 2022-06-25 LAB — VITAMIN D 25 HYDROXY (VIT D DEFICIENCY, FRACTURES): VITD: 30.44 ng/mL (ref 30.00–100.00)

## 2022-06-25 LAB — HEMOGLOBIN A1C: Hgb A1c MFr Bld: 5.6 % (ref 4.6–6.5)

## 2022-06-25 NOTE — Patient Instructions (Signed)
Stop by the lab prior to leaving today. I will notify you of your results once received.  ? ?Call the [facility] to schedule your mammogram.  ? ?It was a pleasure to see you today! ? ?Preventive Care 40-53 Years Old, Female ?Preventive care refers to lifestyle choices and visits with your health care provider that can promote health and wellness. Preventive care visits are also called wellness exams. ?What can I expect for my preventive care visit? ?Counseling ?Your health care provider may ask you questions about your: ?Medical history, including: ?Past medical problems. ?Family medical history. ?Pregnancy history. ?Current health, including: ?Menstrual cycle. ?Method of birth control. ?Emotional well-being. ?Home life and relationship well-being. ?Sexual activity and sexual health. ?Lifestyle, including: ?Alcohol, nicotine or tobacco, and drug use. ?Access to firearms. ?Diet, exercise, and sleep habits. ?Work and work environment. ?Sunscreen use. ?Safety issues such as seatbelt and bike helmet use. ?Physical exam ?Your health care provider will check your: ?Height and weight. These may be used to calculate your BMI (body mass index). BMI is a measurement that tells if you are at a healthy weight. ?Waist circumference. This measures the distance around your waistline. This measurement also tells if you are at a healthy weight and may help predict your risk of certain diseases, such as type 2 diabetes and high blood pressure. ?Heart rate and blood pressure. ?Body temperature. ?Skin for abnormal spots. ?What immunizations do I need? ? ?Vaccines are usually given at various ages, according to a schedule. Your health care provider will recommend vaccines for you based on your age, medical history, and lifestyle or other factors, such as travel or where you work. ?What tests do I need? ?Screening ?Your health care provider may recommend screening tests for certain conditions. This may include: ?Lipid and cholesterol  levels. ?Diabetes screening. This is done by checking your blood sugar (glucose) after you have not eaten for a while (fasting). ?Pelvic exam and Pap test. ?Hepatitis B test. ?Hepatitis C test. ?HIV (human immunodeficiency virus) test. ?STI (sexually transmitted infection) testing, if you are at risk. ?Lung cancer screening. ?Colorectal cancer screening. ?Mammogram. Talk with your health care provider about when you should start having regular mammograms. This may depend on whether you have a family history of breast cancer. ?BRCA-related cancer screening. This may be done if you have a family history of breast, ovarian, tubal, or peritoneal cancers. ?Bone density scan. This is done to screen for osteoporosis. ?Talk with your health care provider about your test results, treatment options, and if necessary, the need for more tests. ?Follow these instructions at home: ?Eating and drinking ? ?Eat a diet that includes fresh fruits and vegetables, whole grains, lean protein, and low-fat dairy products. ?Take vitamin and mineral supplements as recommended by your health care provider. ?Do not drink alcohol if: ?Your health care provider tells you not to drink. ?You are pregnant, may be pregnant, or are planning to become pregnant. ?If you drink alcohol: ?Limit how much you have to 0-1 drink a day. ?Know how much alcohol is in your drink. In the [place] one drink equals one 12 oz bottle of beer (355 mL), one 5 oz glass of wine (148 mL), or one 1? oz glass of hard liquor (44 mL). ?Lifestyle ?Brush your teeth every morning and night with fluoride toothpaste. Floss one time each day. ?Exercise for at least 30 minutes 5 or more days each week. ?Do not use any products that contain nicotine or tobacco. These products include cigarettes, chewing   tobacco, and vaping devices, such as e-cigarettes. If you need help quitting, ask your health care provider. Do not use drugs. If you are sexually active, practice safe sex. Use a  condom or other form of protection to prevent STIs. If you do not wish to become pregnant, use a form of birth control. If you plan to become pregnant, see your health care provider for a prepregnancy visit. Take aspirin only as told by your health care provider. Make sure that you understand how much to take and what form to take. Work with your health care provider to find out whether it is safe and beneficial for you to take aspirin daily. Find healthy ways to manage stress, such as: Meditation, yoga, or listening to music. Journaling. Talking to a trusted person. Spending time with friends and family. Minimize exposure to UV radiation to reduce your risk of skin cancer. Safety Always wear your seat belt while driving or riding in a vehicle. Do not drive: If you have been drinking alcohol. Do not ride with someone who has been drinking. When you are tired or distracted. While texting. If you have been using any mind-altering substances or drugs. Wear a helmet and other protective equipment during sports activities. If you have firearms in your house, make sure you follow all gun safety procedures. Seek help if you have been physically or sexually abused. What's next? Visit your health care provider once a year for an annual wellness visit. Ask your health care provider how often you should have your eyes and teeth checked. Stay up to date on all vaccines. This information is not intended to replace advice given to you by your health care provider. Make sure you discuss any questions you have with your health care provider. Document Revised: 06/12/2021 Document Reviewed: 06/12/2021 Elsevier Patient Education  [place].

## 2022-06-25 NOTE — Assessment & Plan Note (Signed)
Overall controlled.  Continue Topamax 50 mg HS for prevention.  Continue Ibuprofen for abortive treatment for migraines.

## 2022-06-25 NOTE — Assessment & Plan Note (Signed)
Shingrix due, she will set up nurse visits. Pap smear UTD, follows with GYN. Mammogram due, orders placed. Colonoscopy UTD, due 2031  Discussed the importance of a healthy diet and regular exercise in order for weight loss, and to reduce the risk of further co-morbidity.  Exam stable. Labs pending.  Follow up in 1 year for repeat physical.

## 2022-06-25 NOTE — Assessment & Plan Note (Addendum)
Overall stable per patient.  Continue Zoloft 25 mg daily. Continue hydroxyzine 10 mg PRN. She declines to connect with therapy at this time. Continue to monitor.

## 2022-06-25 NOTE — Assessment & Plan Note (Signed)
Continue vitamin D 1000 IU daily.

## 2022-06-25 NOTE — Assessment & Plan Note (Signed)
Controlled.  Continue HCTZ 25 mg daily. CMP pending.

## 2022-06-25 NOTE — Assessment & Plan Note (Signed)
Controlled. No concerns today.

## 2022-06-25 NOTE — Progress Notes (Signed)
Subjective:    Patient ID: [name], female    DOB: May 08, 1971, 51 y.o.   MRN: [id number]  HPI  [name] is a very pleasant 51 y.o. female who presents today for complete physical and follow up of chronic conditions.  Immunizations: -Tetanus: 2017 -Influenza: Completed last season  -Covid-19: 3 vaccines -Shingles: Never completed  Diet: Fair diet.  Exercise: No regular exercise.  Eye exam: Completes annually  Dental exam: Completes semi-annually   Pap Smear: Completed in 2022 per patient Mammogram: Completed in May 2021  Colonoscopy: Completed in 2021, due 2031   BP Readings from Last 3 Encounters:  06/25/22 124/62  01/01/22 124/76  06/20/21 122/82      Review of Systems  Constitutional:  Negative for unexpected weight change.  HENT:  Negative for rhinorrhea.   Respiratory:  Negative for cough and shortness of breath.   Cardiovascular:  Negative for chest pain.  Gastrointestinal:  Negative for constipation and diarrhea.  Genitourinary:  Negative for difficulty urinating.  Musculoskeletal:  Negative for arthralgias and myalgias.  Skin:  Negative for rash.  Allergic/Immunologic: Positive for environmental allergies.  Neurological:  Negative for dizziness and headaches.  Psychiatric/Behavioral:  The patient is not nervous/anxious.          Past Medical History:  Diagnosis Date   Allergy    Anxiety    Headache(784.0)    MIGRAINES   History of chickenpox    History of migraine    Hypertension    Morbid obesity (HCC)    OSA on CPAP    Sleep apnea    no CPAP needed now    Social History   Socioeconomic History   Marital status: Married    Spouse name: Not on file   Number of children: Not on file   Years of education: Not on file   Highest education level: Not on file  Occupational History   Not on file  Tobacco Use   Smoking status: Never   Smokeless tobacco: Never  Vaping Use   Vaping Use: Never used  Substance and Sexual  Activity   Alcohol use: Yes    Alcohol/week: 0.0 standard drinks of alcohol    Comment: occasional glass of wine or mixed drink 2 or 3 times a month   Drug use: No   Sexual activity: Not on file  Other Topics Concern   Not on file  Social History Narrative   Married.   1 child.   Works at [employer].   Enjoys walking, bowling, Nascar, going to the beach.    Social Determinants of Health   Financial Resource Strain: Not on file  Food Insecurity: Not on file  Transportation Needs: Not on file  Physical Activity: Not on file  Stress: Not on file  Social Connections: Not on file  Intimate Partner Violence: Not on file    Past Surgical History:  Procedure Laterality Date   CESAREAN SECTION  05/2005   GASTRIC ROUX-EN-Y N/A 02/27/2014   Procedure: LAPAROSCOPIC ROUX-EN-Y GASTRIC BYPASS WITH UPPER ENDOSCOPY ;  Surgeon: [name], MD;  Location: [facility];  Service: General;  Laterality: N/A;   THERAPEUTIC ABORTION  1988   TUBAL LIGATION  2011    Family History  Problem Relation Age of Onset   Cancer Mother        lung   Hypertension Mother    Diabetes Mother    Emphysema Father    Heart failure Father    Cancer  Maternal Aunt        breast   Colon cancer Neg Hx    Esophageal cancer Neg Hx    Rectal cancer Neg Hx    Stomach cancer Neg Hx     Allergies  Allergen Reactions   Latex     irritation   Lisinopril Hives, Itching and Swelling    Current Outpatient Medications on File Prior to Visit  Medication Sig Dispense Refill   Calcium Carbonate-Vitamin D (CALTRATE 600+D PO) Take 1 capsule by mouth 3 (three) times daily.     cetirizine (ZYRTEC) 10 MG tablet Take 10 mg by mouth daily.     hydrochlorothiazide (HYDRODIURIL) 25 MG tablet TAKE 1 TABLET BY MOUTH EVERY DAY FOR BLOOD PRESSURE. Office visit required for further refills. 90 tablet 0   hydrOXYzine (ATARAX) 10 MG tablet Take 1 tablet (10 mg total) by mouth 2 (two) times daily as needed for anxiety. 30 tablet 0    Ibuprofen 200 MG CAPS Take by mouth as needed.     IRON PO Take 65 mg by mouth daily.     sertraline (ZOLOFT) 50 MG tablet Take 1 tablet (50 mg total) by mouth daily. For anxiety. (Patient taking differently: Take 50 mg by mouth daily. For anxiety. 1/2 tab daily) 60 tablet 0   topiramate (TOPAMAX) 50 MG tablet TAKE 1 TABLET(50 MG) BY MOUTH AT BEDTIME FOR HEADACHE PREVENTION 90 tablet 2   No current facility-administered medications on file prior to visit.    BP 124/62   Pulse 74   Temp 97.6 F (36.4 C) (Oral)   Ht 5' 2"  (1.575 m)   Wt 209 lb (94.8 kg)   SpO2 99%   BMI 38.23 kg/m  Objective:   Physical Exam HENT:     Right Ear: Tympanic membrane and ear canal normal.     Left Ear: Tympanic membrane and ear canal normal.     Nose: Nose normal.  Eyes:     Conjunctiva/sclera: Conjunctivae normal.     Pupils: Pupils are equal, round, and reactive to light.  Neck:     Thyroid: No thyromegaly.  Cardiovascular:     Rate and Rhythm: Normal rate and regular rhythm.     Heart sounds: No murmur heard. Pulmonary:     Effort: Pulmonary effort is normal.     Breath sounds: Normal breath sounds. No rales.  Abdominal:     General: Bowel sounds are normal.     Palpations: Abdomen is soft.     Tenderness: There is no abdominal tenderness.  Musculoskeletal:        General: Normal range of motion.     Cervical back: Neck supple.  Lymphadenopathy:     Cervical: No cervical adenopathy.  Skin:    General: Skin is warm and dry.     Findings: No rash.  Neurological:     Mental Status: She is alert and oriented to person, place, and time.     Cranial Nerves: No cranial nerve deficit.     Deep Tendon Reflexes: Reflexes are normal and symmetric.  Psychiatric:        Mood and Affect: Mood normal.           Assessment & Plan:   Problem List Items Addressed This Visit       Cardiovascular and Mediastinum   HTN (hypertension) (Chronic)    Controlled.  Continue HCTZ 25 mg  daily. CMP pending.      Relevant Orders   Lipid panel  Hemoglobin A1c   Comprehensive metabolic panel   Migraines    Overall controlled.  Continue Topamax 50 mg HS for prevention.  Continue Ibuprofen for abortive treatment for migraines.         Other   Preventative health care - Primary    Shingrix due, she will set up nurse visits. Pap smear UTD, follows with GYN. Mammogram due, orders placed. Colonoscopy UTD, due 2031  Discussed the importance of a healthy diet and regular exercise in order for weight loss, and to reduce the risk of further co-morbidity.  Exam stable. Labs pending.  Follow up in 1 year for repeat physical.       GAD (generalized anxiety disorder)    Overall stable per patient.  Continue Zoloft 25 mg daily. Continue hydroxyzine 10 mg PRN. She declines to connect with therapy at this time. Continue to monitor.       Constipation    Controlled. No concerns today.      Vitamin D deficiency    Continue vitamin D 1000 IU daily.       Relevant Orders   VITAMIN D 25 Hydroxy (Vit-D Deficiency, Fractures)   Other Visit Diagnoses     Encounter for screening mammogram for malignant neoplasm of breast       Relevant Orders   MM 3D SCREEN BREAST BILATERAL          Pleas Koch, NP

## 2022-07-05 ENCOUNTER — Other Ambulatory Visit: Payer: Self-pay | Admitting: Primary Care

## 2022-07-05 DIAGNOSIS — G43901 Migraine, unspecified, not intractable, with status migrainosus: Secondary | ICD-10-CM

## 2022-07-31 ENCOUNTER — Ambulatory Visit (INDEPENDENT_AMBULATORY_CARE_PROVIDER_SITE_OTHER): Payer: BC Managed Care – PPO

## 2022-07-31 DIAGNOSIS — Z23 Encounter for immunization: Secondary | ICD-10-CM | POA: Diagnosis not present

## 2022-08-21 DIAGNOSIS — Z01419 Encounter for gynecological examination (general) (routine) without abnormal findings: Secondary | ICD-10-CM | POA: Diagnosis not present

## 2022-09-04 ENCOUNTER — Ambulatory Visit
Admission: RE | Admit: 2022-09-04 | Discharge: 2022-09-04 | Disposition: A | Discharge status: Home / Self Care | Payer: BC Managed Care – PPO | Source: Ambulatory Visit | Attending: Primary Care | Admitting: Primary Care

## 2022-09-04 DIAGNOSIS — Z1231 Encounter for screening mammogram for malignant neoplasm of breast: Secondary | ICD-10-CM | POA: Insufficient documentation

## 2022-09-08 ENCOUNTER — Other Ambulatory Visit: Payer: Self-pay | Admitting: Primary Care

## 2022-09-08 DIAGNOSIS — I10 Essential (primary) hypertension: Secondary | ICD-10-CM

## 2022-10-15 ENCOUNTER — Ambulatory Visit (INDEPENDENT_AMBULATORY_CARE_PROVIDER_SITE_OTHER): Payer: BC Managed Care – PPO

## 2022-10-15 DIAGNOSIS — Z23 Encounter for immunization: Secondary | ICD-10-CM | POA: Diagnosis not present

## 2022-11-04 ENCOUNTER — Ambulatory Visit: Payer: BC Managed Care – PPO

## 2022-11-06 ENCOUNTER — Ambulatory Visit (INDEPENDENT_AMBULATORY_CARE_PROVIDER_SITE_OTHER): Payer: BC Managed Care – PPO

## 2022-11-06 DIAGNOSIS — Z23 Encounter for immunization: Secondary | ICD-10-CM | POA: Diagnosis not present

## 2022-11-06 NOTE — Progress Notes (Signed)
Per orders of Karl Ito, NP , injection of Shingles #2 given by Pat Kocher in right deltoid. Patient tolerated injection well.

## 2023-01-30 ENCOUNTER — Encounter: Payer: Self-pay | Admitting: Family Medicine

## 2023-01-30 ENCOUNTER — Ambulatory Visit: Payer: BC Managed Care – PPO | Admitting: Family Medicine

## 2023-01-30 VITALS — BP 132/76 | HR 77 | Temp 97.2°F | Ht 62.0 in | Wt 213.2 lb

## 2023-01-30 DIAGNOSIS — J019 Acute sinusitis, unspecified: Secondary | ICD-10-CM

## 2023-01-30 DIAGNOSIS — J0100 Acute maxillary sinusitis, unspecified: Secondary | ICD-10-CM | POA: Diagnosis not present

## 2023-01-30 HISTORY — DX: Acute sinusitis, unspecified: J01.90

## 2023-01-30 MED ORDER — AMOXICILLIN-POT CLAVULANATE 875-125 MG PO TABS: 1.0000 | ORAL_TABLET | Freq: Two times a day (BID) | ORAL | 0 refills | Status: DC

## 2023-01-30 NOTE — Patient Instructions (Addendum)
Drink lots of fluids   Breathe steam Treat your symptoms   Take augmentin as directed   Simply saline spray is helpful for congestion  Ibuprofen is helpful for headache and congestion (take it with food)

## 2023-01-30 NOTE — Assessment & Plan Note (Signed)
L sided congestion /facial pain/ha and purulent mucous  2 wk after uri  Also triggering migraines Reassuring exam Disc sympt care, fluids, saline, steam, nsaid Disc ER precautions  Px augmentin  Handout given  Consider prednisone if not improving Update if not starting to improve in a week or if worsening

## 2023-01-30 NOTE — Progress Notes (Signed)
Subjective:    Patient ID: [name], female    DOB: 12/16/1971, 52 y.o.   MRN: [id number]  HPI 52 yo pf of NP Clark presents with sinus symptoms   Wt Readings from Last 3 Encounters:  01/30/23 213 lb 4 oz (96.7 kg)  06/25/22 209 lb (94.8 kg)  02/12/22 204 lb (92.5 kg)   39.00 kg/m  Vitals:   01/30/23 1052  BP: 132/76  Pulse: 77  Temp: (!) 97.2 F (36.2 C)  SpO2: 100%     2 weeks ago- had a cough  Then more congestion in head    Nasal congestion  Ha -head hurts worse first thing in am Worse in R face and forehead  The congestion is worse on that side   Cough : hears the rattle/congestion  Phlegm is dark yellow   Throat is good  R ear hurts also   She has a h/o migraines but this is different    Otc Cough syrup/ thera flu   Patient Active Problem List   Diagnosis Date Noted   Acute sinusitis 01/30/2023   Abnormal vaginal bleeding in premenopausal patient 06/20/2021   Constipation 12/03/2018   Vitamin D deficiency 12/03/2018   GAD (generalized anxiety disorder) 11/24/2017   Hyperlipidemia 07/17/2016   Preventative health care 07/17/2016   Migraines 04/16/2016   HTN (hypertension) 03/01/2014   Obesity (BMI 30-39.9) 02/27/2014   Past Medical History:  Diagnosis Date   Allergy    Anxiety    Headache(784.0)    MIGRAINES   History of chickenpox    History of migraine    Hypertension    Morbid obesity (HCC)    OSA on CPAP    Sleep apnea    no CPAP needed now   Past Surgical History:  Procedure Laterality Date   CESAREAN SECTION  05/2005   GASTRIC ROUX-EN-Y N/A 02/27/2014   Procedure: LAPAROSCOPIC ROUX-EN-Y GASTRIC BYPASS WITH UPPER ENDOSCOPY ;  Surgeon: [name], MD;  Location: [facility];  Service: General;  Laterality: N/A;   THERAPEUTIC ABORTION  1988   TUBAL LIGATION  2011   Social History   Tobacco Use   Smoking status: Never   Smokeless tobacco: Never  Vaping Use   Vaping Use: Never used  Substance Use Topics   Alcohol  use: Yes    Alcohol/week: 0.0 standard drinks of alcohol    Comment: occasional glass of wine or mixed drink 2 or 3 times a month   Drug use: No   Family History  Problem Relation Age of Onset   Cancer Mother        lung   Hypertension Mother    Diabetes Mother    Emphysema Father    Heart failure Father    Cancer Maternal Aunt        breast   Colon cancer Neg Hx    Esophageal cancer Neg Hx    Rectal cancer Neg Hx    Stomach cancer Neg Hx    Allergies  Allergen Reactions   Latex     irritation   Lisinopril Hives, Itching and Swelling   Current Outpatient Medications on File Prior to Visit  Medication Sig Dispense Refill   Calcium Carbonate-Vitamin D (CALTRATE 600+D PO) Take 1 capsule by mouth 3 (three) times daily.     cetirizine (ZYRTEC) 10 MG tablet Take 10 mg by mouth daily.     hydrochlorothiazide (HYDRODIURIL) 25 MG tablet TAKE 1 TABLET BY MOUTH EVERY DAY for blood  pressure. 90 tablet 2   hydrOXYzine (ATARAX) 10 MG tablet Take 1 tablet (10 mg total) by mouth 2 (two) times daily as needed for anxiety. 30 tablet 0   Ibuprofen 200 MG CAPS Take by mouth as needed.     IRON PO Take 65 mg by mouth daily.     sertraline (ZOLOFT) 50 MG tablet Take 1 tablet (50 mg total) by mouth daily. For anxiety. (Patient taking differently: Take 50 mg by mouth daily. For anxiety. 1/2 tab daily) 60 tablet 0   topiramate (TOPAMAX) 50 MG tablet TAKE 1 TABLET(50 MG) BY MOUTH AT BEDTIME FOR HEADACHE PREVENTION 90 tablet 3   No current facility-administered medications on file prior to visit.    Review of Systems  Constitutional:  Positive for appetite change. Negative for fatigue and fever.  HENT:  Positive for congestion, ear pain, postnasal drip, rhinorrhea, sinus pressure and sore throat. Negative for nosebleeds.   Eyes:  Negative for pain, redness and itching.  Respiratory:  Positive for cough. Negative for shortness of breath and wheezing.   Cardiovascular:  Negative for chest pain.   Gastrointestinal:  Negative for abdominal pain, diarrhea, nausea and vomiting.  Endocrine: Negative for polyuria.  Genitourinary:  Negative for dysuria, frequency and urgency.  Musculoskeletal:  Negative for arthralgias and myalgias.  Allergic/Immunologic: Negative for immunocompromised state.  Neurological:  Positive for headaches. Negative for dizziness, tremors, syncope, weakness and numbness.  Hematological:  Negative for adenopathy. Does not bruise/bleed easily.  Psychiatric/Behavioral:  Negative for dysphoric mood. The patient is not nervous/anxious.        Objective:   Physical Exam Constitutional:      General: She is not in acute distress.    Appearance: Normal appearance. She is well-developed. She is obese. She is not ill-appearing.  HENT:     Head: Normocephalic and atraumatic.     Comments: R frontal and maxillary sinus tenderness No facial swelling    Right Ear: Tympanic membrane and external ear normal.     Left Ear: Tympanic membrane and external ear normal.     Ears:     Comments: R TM has small effusion No erythema or bulging     Nose: Congestion and rhinorrhea present.     Mouth/Throat:     Pharynx: Oropharynx is clear. No oropharyngeal exudate or posterior oropharyngeal erythema.     Comments: Clear pnd Eyes:     General:        Right eye: No discharge.        Left eye: No discharge.     Conjunctiva/sclera: Conjunctivae normal.     Pupils: Pupils are equal, round, and reactive to light.  Cardiovascular:     Rate and Rhythm: Normal rate and regular rhythm.  Pulmonary:     Effort: Pulmonary effort is normal. No respiratory distress.     Breath sounds: Normal breath sounds. No stridor. No wheezing, rhonchi or rales.     Comments: Good air exch No rales or rhonchi  Some upper airway sounds cleared by cough Musculoskeletal:     Cervical back: Normal range of motion and neck supple.  Lymphadenopathy:     Cervical: No cervical adenopathy.  Skin:     General: Skin is warm and dry.     Findings: No rash.  Neurological:     Mental Status: She is alert.     Cranial Nerves: No cranial nerve deficit.     Coordination: Coordination normal.  Psychiatric:  Mood and Affect: Mood normal.           Assessment & Plan:   Problem List Items Addressed This Visit       Respiratory   Acute sinusitis - Primary    L sided congestion /facial pain/ha and purulent mucous  2 wk after uri  Also triggering migraines Reassuring exam Disc sympt care, fluids, saline, steam, nsaid Disc ER precautions  Px augmentin  Handout given  Consider prednisone if not improving Update if not starting to improve in a week or if worsening        Relevant Medications   amoxicillin-clavulanate (AUGMENTIN) 875-125 MG tablet

## 2023-10-27 ENCOUNTER — Encounter: Payer: Self-pay | Admitting: Primary Care

## 2023-10-27 ENCOUNTER — Ambulatory Visit (INDEPENDENT_AMBULATORY_CARE_PROVIDER_SITE_OTHER): Payer: BC Managed Care – PPO | Admitting: Primary Care

## 2023-10-27 VITALS — BP 128/68 | HR 64 | Temp 97.2°F | Ht 62.0 in | Wt 214.0 lb

## 2023-10-27 DIAGNOSIS — M546 Pain in thoracic spine: Secondary | ICD-10-CM | POA: Diagnosis not present

## 2023-10-27 DIAGNOSIS — G43901 Migraine, unspecified, not intractable, with status migrainosus: Secondary | ICD-10-CM

## 2023-10-27 DIAGNOSIS — R1011 Right upper quadrant pain: Secondary | ICD-10-CM | POA: Insufficient documentation

## 2023-10-27 HISTORY — DX: Right upper quadrant pain: R10.11

## 2023-10-27 LAB — COMPREHENSIVE METABOLIC PANEL
ALT: 11 U/L (ref 0–35)
AST: 17 U/L (ref 0–37)
Albumin: 4.5 g/dL (ref 3.5–5.2)
Alkaline Phosphatase: 73 U/L (ref 39–117)
BUN: 11 mg/dL (ref 6–23)
CO2: 29 meq/L (ref 19–32)
Calcium: 10 mg/dL (ref 8.4–10.5)
Chloride: 103 meq/L (ref 96–112)
Creatinine, Ser: 0.7 mg/dL (ref 0.40–1.20)
GFR: 98.16 mL/min (ref >60.00)
Glucose, Bld: 87 mg/dL (ref 70–99)
Potassium: 4.3 meq/L (ref 3.5–5.1)
Sodium: 139 meq/L (ref 135–145)
Total Bilirubin: 0.4 mg/dL (ref 0.2–1.2)
Total Protein: 7.5 g/dL (ref 6.0–8.3)

## 2023-10-27 LAB — CBC WITH DIFFERENTIAL/PLATELET
Basophils Absolute: 0 10*3/uL (ref 0.0–0.1)
Basophils Relative: 0.8 % (ref 0.0–3.0)
Eosinophils Absolute: 0.1 10*3/uL (ref 0.0–0.7)
Eosinophils Relative: 2.6 % (ref 0.0–5.0)
HCT: 41.5 % (ref 36.0–46.0)
Hemoglobin: 13.1 g/dL (ref 12.0–15.0)
Lymphocytes Relative: 42.6 % (ref 12.0–46.0)
Lymphs Abs: 2.2 10*3/uL (ref 0.7–4.0)
MCHC: 31.5 g/dL (ref 30.0–36.0)
MCV: 86.4 fL (ref 78.0–100.0)
Monocytes Absolute: 0.4 10*3/uL (ref 0.1–1.0)
Monocytes Relative: 7.2 % (ref 3.0–12.0)
Neutro Abs: 2.4 10*3/uL (ref 1.4–7.7)
Neutrophils Relative %: 46.8 % (ref 43.0–77.0)
Platelets: 271 10*3/uL (ref 150.0–400.0)
RBC: 4.8 Mil/uL (ref 3.87–5.11)
RDW: 14.7 % (ref 11.5–15.5)
WBC: 5.2 10*3/uL (ref 4.0–10.5)

## 2023-10-27 MED ORDER — TOPIRAMATE 50 MG PO TABS: 50.0000 mg | ORAL_TABLET | Freq: Every day | ORAL | 0 refills | Status: DC

## 2023-10-27 NOTE — Assessment & Plan Note (Addendum)
Acute RUQ pain, differentials include cholelithiasis, cholecystitis. Lower suspicion for acute appendicitis, pancreatitis, GERD.  [place] of gallbladder ordered to evaluate.   CMP, CBC w/ differential pending.   Discussed avoiding spicy, fatty, and fried foods.   Await results.   Schedule annual physical.  I evaluated patient, was consulted regarding treatment, and agree with assessment and plan per Tenna Delaine, RN, DNP student.   Mayra Reel, NP-C

## 2023-10-27 NOTE — Patient Instructions (Signed)
Stop by the lab prior to leaving today. I will notify you of your results once received.   I ordered a stat ultrasound of the gallbladder. You will get a phone call about this to schedule it.   I will be in touch when I get the results.   Avoid spicy, fatty, and fried foods.   Schedule annual physical.   It was a pleasure to see you today!

## 2023-10-27 NOTE — Assessment & Plan Note (Addendum)
Differentials include musculoskeletal cause, posture, large breast size.   Improve posture and increase movement while working at home.   Consider x-ray and PT referral if no improvement.  She will update.  Schedule annual physical.   I evaluated patient, was consulted regarding treatment, and agree with assessment and plan per Tenna Delaine, RN, DNP student.   Mayra Reel, NP-C

## 2023-10-27 NOTE — Progress Notes (Addendum)
Subjective:    Patient ID: [name], female    DOB: 1971/02/12, 52 y.o.   MRN: [id number]  HPI  [name] is a very pleasant 52 y.o. female with a history of hypertension, migraines, hyperlipidemia, gastric bypass roux-en-y abnormal vaginal bleeding who presents today to discuss abdominal pain.  She is also needing a refill of her Topamax.  Her pain is located to the RUQ which she first noticed 6-8 weeks ago. Her pain is intermittent, occurs within 15-30 minutes of eating or drinking, lasts for 1-2 hours. Given her history of gastric bypass she eats smaller meals at baseline. She avoids laying flat within 2 hours of eating. She's been taking Ibuprofen and Gas X without improvement.   She denies nausea, vomiting, diarrhea, esophageal burning, chest pressure, dysuria, urinary frequency.   About one month ago she began noticing midline thoracic back. She believes her pain is secondary to her large breast size. She works from home and doesn't wear a bra. But when putting on a bra she notices her back pain, feels the pulling in the back.    Review of Systems  Gastrointestinal:  Positive for abdominal pain. Negative for blood in stool, constipation, diarrhea, nausea and vomiting.  Genitourinary:  Negative for dysuria, flank pain, frequency and hematuria.  Musculoskeletal:  Positive for back pain.         Past Medical History:  Diagnosis Date   Acute sinusitis 01/30/2023   Allergy    Anxiety    Headache(784.0)    MIGRAINES   History of chickenpox    History of migraine    Hypertension    Morbid obesity (HCC)    OSA on CPAP    Sleep apnea    no CPAP needed now    Social History   Socioeconomic History   Marital status: Married    Spouse name: Not on file   Number of children: Not on file   Years of education: Not on file   Highest education level: Not on file  Occupational History   Not on file  Tobacco Use   Smoking status: Never   Smokeless tobacco:  Never  Vaping Use   Vaping status: Never Used  Substance and Sexual Activity   Alcohol use: Yes    Alcohol/week: 0.0 standard drinks of alcohol    Comment: occasional glass of wine or mixed drink 2 or 3 times a month   Drug use: No   Sexual activity: Not on file  Other Topics Concern   Not on file  Social History Narrative   Married.   1 child.   Works at [employer].   Enjoys walking, bowling, Nascar, going to the beach.    Social Determinants of Health   Financial Resource Strain: Not on file  Food Insecurity: Not on file  Transportation Needs: Not on file  Physical Activity: Not on file  Stress: Not on file  Social Connections: Unknown (05/13/2022)   Received from [facility], [facility]   Social Network    Social Network: Not on file  Intimate Partner Violence: Unknown (04/04/2022)   Received from [facility], [facility]   HITS    Physically Hurt: Not on file    Insult or Talk Down To: Not on file    Threaten Physical Harm: Not on file    Scream or Curse: Not on file    Past Surgical History:  Procedure Laterality Date   CESAREAN SECTION  05/2005   GASTRIC  ROUX-EN-Y N/A 02/27/2014   Procedure: LAPAROSCOPIC ROUX-EN-Y GASTRIC BYPASS WITH UPPER ENDOSCOPY ;  Surgeon: Atilano Ina, MD;  Location: [facility];  Service: General;  Laterality: N/A;   THERAPEUTIC ABORTION  1988   TUBAL LIGATION  2011    Family History  Problem Relation Age of Onset   Cancer Mother        lung   Hypertension Mother    Diabetes Mother    Emphysema Father    Heart failure Father    Cancer Maternal Aunt        breast   Colon cancer Neg Hx    Esophageal cancer Neg Hx    Rectal cancer Neg Hx    Stomach cancer Neg Hx     Allergies  Allergen Reactions   Latex     irritation   Lisinopril Hives, Itching and Swelling    Current Outpatient Medications on File Prior to Visit  Medication Sig Dispense Refill   hydrochlorothiazide (HYDRODIURIL) 25 MG tablet TAKE 1 TABLET BY MOUTH  EVERY DAY for blood pressure. 90 tablet 2   hydrOXYzine (ATARAX) 10 MG tablet Take 1 tablet (10 mg total) by mouth 2 (two) times daily as needed for anxiety. 30 tablet 0   Ibuprofen 200 MG CAPS Take by mouth as needed.     sertraline (ZOLOFT) 50 MG tablet Take 1 tablet (50 mg total) by mouth daily. For anxiety. (Patient taking differently: Take 50 mg by mouth daily. For anxiety. 1/2 tab daily) 60 tablet 0   Calcium Carbonate-Vitamin D (CALTRATE 600+D PO) Take 1 capsule by mouth 3 (three) times daily. (Patient not taking: Reported on 10/27/2023)     cetirizine (ZYRTEC) 10 MG tablet Take 10 mg by mouth daily. (Patient not taking: Reported on 10/27/2023)     IRON PO Take 65 mg by mouth daily. (Patient not taking: Reported on 10/27/2023)     No current facility-administered medications on file prior to visit.    BP 128/68   Pulse 64   Temp (!) 97.2 F (36.2 C) (Temporal)   Ht 5\' 2"  (1.575 m)   Wt 214 lb (97.1 kg)   LMP 01/06/2020 Comment: had a tubal  SpO2 100%   BMI 39.14 kg/m  Objective:   Physical Exam Cardiovascular:     Rate and Rhythm: Normal rate and regular rhythm.  Pulmonary:     Effort: Pulmonary effort is normal.     Breath sounds: Normal breath sounds.  Abdominal:     Tenderness: There is abdominal tenderness in the right upper quadrant, epigastric area, left upper quadrant and left lower quadrant. Positive signs include Murphy's sign.  Musculoskeletal:     Cervical back: Neck supple.  Skin:    General: Skin is warm and dry.  Neurological:     Mental Status: She is alert and oriented to person, place, and time.  Psychiatric:        Mood and Affect: Mood normal.           Assessment & Plan:  RUQ pain Assessment & Plan: Acute RUQ pain, differentials include cholelithiasis, cholecystitis. Lower suspicion for acute appendicitis, pancreatitis, GERD.  [place] of gallbladder ordered to evaluate.   CMP, CBC w/ differential pending.   Discussed avoiding spicy, fatty,  and fried foods.   Await results.   Schedule annual physical.  I evaluated patient, was consulted regarding treatment, and agree with assessment and plan per Tenna Delaine, RN, DNP student.   Mayra Reel, NP-C   Orders: -  US ABDOMEN LIMITED RUQ (LIVER/GB); Future -     Comprehensive metabolic panel -     CBC with Differential/Platelet  Migraine with status migrainosus, not intractable, unspecified migraine type -     Topiramate; Take 1 tablet (50 mg total) by mouth at bedtime. For headache prevention  Dispense: 90 tablet; Refill: 0  Acute midline thoracic back pain Assessment & Plan: Differentials include musculoskeletal cause, posture, large breast size.   Improve posture and increase movement while working at home.   Consider x-ray and PT referral if no improvement.  She will update.  Schedule annual physical.   I evaluated patient, was consulted regarding treatment, and agree with assessment and plan per Tenna Delaine, RN, DNP student.   Mayra Reel, NP-C          Doreene Nest, NP

## 2023-10-27 NOTE — Progress Notes (Signed)
Acute Office Visit  Subjective:     Patient ID: [name], female    DOB: 1971-07-21, 52 y.o.   MRN: [id number]  Chief Complaint  Patient presents with   Back Pain    Intermittent back and right upper stomach pain x2 months.      Back Pain Associated symptoms include abdominal pain. Pertinent negatives include no chest pain, dysuria, fever, headaches or weight loss.    [name] is a pleasant 52 y.o. female with a history of hypertension, anxiety, migraines, constipation, weight loss surgery who presents today to discuss stomach pain and back pain.   Symptom onset 6-8 weeks ago with RUQ pain. The pain starts within 15 minutes of eating or drinking and takes an hour to and hour and half to subside. It does not radiate. The pain is now occurring every other day and is bothersome. She has tried eating smaller portions, eating slowly, not eating and drinking at the same time, and sitting up after eating but this has not helped. She has also tried Gas-X, Tylenol, and Ibuprofen but no relief. She puts Miralax in her morning coffee and has regular bowel movements with this, she is not constipated. She denies trauma, falls, changes to diet or routine.  She also has some midline upper thoracic pain that started 1 month ago. She has had back pain in the past, but after losing weight with the weight loss surgery her back pain resolved. She feels this new pain may be related to her bra causing discomfort. She works from home and sits for long periods of time.    Review of Systems  Constitutional:  Negative for chills, fever and weight loss.  Eyes:  Negative for blurred vision and double vision.  Respiratory:  Negative for shortness of breath.   Cardiovascular:  Negative for chest pain.  Gastrointestinal:  Positive for abdominal pain. Negative for blood in stool, constipation, diarrhea, heartburn, nausea and vomiting.       RUQ  Genitourinary:  Negative for dysuria, flank pain and  hematuria.  Musculoskeletal:  Positive for back pain.  Neurological:  Negative for dizziness and headaches.  Psychiatric/Behavioral:  Negative for depression. The patient is not nervous/anxious.       Objective:    BP 128/68   Pulse 64   Temp (!) 97.2 F (36.2 C) (Temporal)   Ht 5\' 2"  (1.575 m)   Wt 214 lb (97.1 kg)   LMP 01/06/2020 Comment: had a tubal  SpO2 100%   BMI 39.14 kg/m   BP Readings from Last 3 Encounters:  10/27/23 128/68  01/30/23 132/76  06/25/22 124/62   Wt Readings from Last 3 Encounters:  10/27/23 214 lb (97.1 kg)  01/30/23 213 lb 4 oz (96.7 kg)  06/25/22 209 lb (94.8 kg)   Physical Exam Constitutional:      Appearance: Normal appearance.  Cardiovascular:     Rate and Rhythm: Normal rate and regular rhythm.     Heart sounds: Normal heart sounds.  Pulmonary:     Breath sounds: Normal breath sounds.  Abdominal:     General: Abdomen is flat. Bowel sounds are normal. There is no distension.     Palpations: Abdomen is soft.     Tenderness: There is abdominal tenderness. There is no guarding.  Skin:    General: Skin is warm and dry.  Neurological:     Mental Status: She is alert and oriented to person, place, and time.  Psychiatric:  Mood and Affect: Mood normal.        Behavior: Behavior normal.    No results found for any visits on 10/27/23.     Assessment & Plan:   Problem List Items Addressed This Visit       Cardiovascular and Mediastinum   Migraines   Relevant Medications   topiramate (TOPAMAX) 50 MG tablet     Other   RUQ pain - Primary    Acute RUQ pain, unclear etiology.   [place] of gallbladder ordered to evaluate.   CMP, CBC w/ differential pending.   Discussed avoiding spicy, fatty, and fried foods.   Await results.   Schedule annual physical.      Relevant Orders   US Abdomen Limited RUQ (LIVER/GB)   Comprehensive metabolic panel   CBC with Differential   Thoracic back pain    Differentials include  musculoskeletal cause, posture, large breast size.   Improve posture and increase movement while working at home.   Consider x-ray and PT referral if no improvement.  She will update.  Schedule annual physical.        Meds ordered this encounter  Medications   topiramate (TOPAMAX) 50 MG tablet    Sig: Take 1 tablet (50 mg total) by mouth at bedtime. For headache prevention    Dispense:  90 tablet    Refill:  0    Order Specific Question:   Supervising Provider    Answer:   BEDSOLE, AMY E [[id number]]    No follow-ups on file.  Benito Mccreedy, RN

## 2023-10-29 ENCOUNTER — Ambulatory Visit
Admission: RE | Admit: 2023-10-29 | Discharge: 2023-10-29 | Disposition: A | Discharge status: Home / Self Care | Payer: BC Managed Care – PPO | Source: Ambulatory Visit | Attending: Primary Care | Admitting: Primary Care

## 2023-10-29 DIAGNOSIS — K760 Fatty (change of) liver, not elsewhere classified: Secondary | ICD-10-CM | POA: Diagnosis not present

## 2023-10-29 DIAGNOSIS — R1011 Right upper quadrant pain: Secondary | ICD-10-CM | POA: Insufficient documentation

## 2023-10-29 DIAGNOSIS — K8020 Calculus of gallbladder without cholecystitis without obstruction: Secondary | ICD-10-CM | POA: Diagnosis not present

## 2023-11-05 ENCOUNTER — Encounter: Payer: Self-pay | Admitting: Primary Care

## 2023-11-05 ENCOUNTER — Ambulatory Visit: Payer: BC Managed Care – PPO | Admitting: Primary Care

## 2023-11-05 VITALS — BP 122/82 | HR 73 | Temp 98.2°F | Ht 62.5 in | Wt 207.2 lb

## 2023-11-05 DIAGNOSIS — I10 Essential (primary) hypertension: Secondary | ICD-10-CM

## 2023-11-05 DIAGNOSIS — Z23 Encounter for immunization: Secondary | ICD-10-CM | POA: Diagnosis not present

## 2023-11-05 DIAGNOSIS — K5900 Constipation, unspecified: Secondary | ICD-10-CM

## 2023-11-05 DIAGNOSIS — Z0001 Encounter for general adult medical examination with abnormal findings: Secondary | ICD-10-CM

## 2023-11-05 DIAGNOSIS — E559 Vitamin D deficiency, unspecified: Secondary | ICD-10-CM

## 2023-11-05 DIAGNOSIS — E785 Hyperlipidemia, unspecified: Secondary | ICD-10-CM

## 2023-11-05 DIAGNOSIS — G43009 Migraine without aura, not intractable, without status migrainosus: Secondary | ICD-10-CM

## 2023-11-05 DIAGNOSIS — R1011 Right upper quadrant pain: Secondary | ICD-10-CM | POA: Diagnosis not present

## 2023-11-05 DIAGNOSIS — Z1231 Encounter for screening mammogram for malignant neoplasm of breast: Secondary | ICD-10-CM

## 2023-11-05 DIAGNOSIS — F411 Generalized anxiety disorder: Secondary | ICD-10-CM

## 2023-11-05 LAB — LIPID PANEL
Cholesterol: 235 mg/dL — ABNORMAL HIGH (ref 0–200)
HDL: 83.6 mg/dL (ref >39.00)
LDL Cholesterol: 131 mg/dL — ABNORMAL HIGH (ref 0–99)
NonHDL: 151.46
Total CHOL/HDL Ratio: 3
Triglycerides: 104 mg/dL (ref 0.0–149.0)
VLDL: 20.8 mg/dL (ref 0.0–40.0)

## 2023-11-05 LAB — VITAMIN D 25 HYDROXY (VIT D DEFICIENCY, FRACTURES): VITD: 20.5 ng/mL — ABNORMAL LOW (ref 30.00–100.00)

## 2023-11-05 MED ORDER — ESCITALOPRAM OXALATE 5 MG PO TABS: 5.0000 mg | ORAL_TABLET | Freq: Every day | ORAL | 0 refills | Status: DC

## 2023-11-05 NOTE — Progress Notes (Signed)
Subjective:    Patient ID: [name], female    DOB: Jan 27, 1971, 52 y.o.   MRN: [id number]  HPI  [name] is a very pleasant 52 y.o. female who presents today for complete physical and follow up of chronic conditions.  She would also like to discuss chronic anxiety. She is under a lot of personal stress, trying to get her mother and aunt settled with assisted living. Previously managed on Zoloft 25 or 50 mg which helped take the edge off but caused her to "slow down" too much. Symptoms include mind racing thoughts, worrying, trouble relaxing, feeling overwhelmed, not feeling rested during the day, not sleeping well.   Immunizations: -Tetanus: Completed in 2017 -Influenza: Influenza vaccine provided today.  -Shingles: Completed Shingrix series  Diet: Fair diet.  Exercise: No regular exercise.  Eye exam: Completes annually  Dental exam: Completes semi-annually    Pap Smear: UTD, follows with GYN Mammogram: September 2023  Colonoscopy: Completed in 2021, due 2031   BP Readings from Last 3 Encounters:  11/05/23 122/82  10/27/23 128/68  01/30/23 132/76      Review of Systems  Constitutional:  Negative for unexpected weight change.  HENT:  Negative for rhinorrhea.   Respiratory:  Negative for cough and shortness of breath.   Cardiovascular:  Negative for chest pain.  Gastrointestinal:  Positive for abdominal pain. Negative for constipation and diarrhea.  Genitourinary:  Negative for difficulty urinating.  Musculoskeletal:  Negative for arthralgias and myalgias.  Skin:  Negative for rash.  Allergic/Immunologic: Negative for environmental allergies.  Neurological:  Negative for dizziness, numbness and headaches.  Psychiatric/Behavioral:  The patient is not nervous/anxious.          Past Medical History:  Diagnosis Date   Acute sinusitis 01/30/2023   Allergy    Anxiety    Headache(784.0)    MIGRAINES   History of chickenpox    History of migraine     Hypertension    Morbid obesity (HCC)    OSA on CPAP    Sleep apnea    no CPAP needed now    Social History   Socioeconomic History   Marital status: Married    Spouse name: Not on file   Number of children: Not on file   Years of education: Not on file   Highest education level: Not on file  Occupational History   Not on file  Tobacco Use   Smoking status: Never   Smokeless tobacco: Never  Vaping Use   Vaping status: Never Used  Substance and Sexual Activity   Alcohol use: Yes    Alcohol/week: 0.0 standard drinks of alcohol    Comment: occasional glass of wine or mixed drink 2 or 3 times a month   Drug use: No   Sexual activity: Not on file  Other Topics Concern   Not on file  Social History Narrative   Married.   1 child.   Works at [employer].   Enjoys walking, bowling, Nascar, going to the beach.    Social Determinants of Health   Financial Resource Strain: Not on file  Food Insecurity: Not on file  Transportation Needs: Not on file  Physical Activity: Not on file  Stress: Not on file  Social Connections: Unknown (05/13/2022)   Received from [facility], [facility]   Social Network    Social Network: Not on file  Intimate Partner Violence: Unknown (04/04/2022)   Received [facility], [facility]   HITS  Physically Hurt: Not on file    Insult or Talk Down To: Not on file    Threaten Physical Harm: Not on file    Scream or Curse: Not on file    Past Surgical History:  Procedure Laterality Date   CESAREAN SECTION  05/2005   GASTRIC ROUX-EN-Y N/A 02/27/2014   Procedure: LAPAROSCOPIC ROUX-EN-Y GASTRIC BYPASS WITH UPPER ENDOSCOPY ;  Surgeon: Atilano Ina, MD;  Location: [facility];  Service: General;  Laterality: N/A;   THERAPEUTIC ABORTION  1988   TUBAL LIGATION  2011    Family History  Problem Relation Age of Onset   Cancer Mother        lung   Hypertension Mother    Diabetes Mother    Emphysema Father    Heart failure Father     Cancer Maternal Aunt        breast   Colon cancer Neg Hx    Esophageal cancer Neg Hx    Rectal cancer Neg Hx    Stomach cancer Neg Hx     Allergies  Allergen Reactions   Latex     irritation   Lisinopril Hives, Itching and Swelling    Current Outpatient Medications on File Prior to Visit  Medication Sig Dispense Refill   Calcium Carbonate-Vitamin D (CALTRATE 600+D PO) Take 1 capsule by mouth 3 (three) times daily.     cetirizine (ZYRTEC) 10 MG tablet Take 10 mg by mouth daily.     hydrochlorothiazide (HYDRODIURIL) 25 MG tablet TAKE 1 TABLET BY MOUTH EVERY DAY for blood pressure. 90 tablet 2   hydrOXYzine (ATARAX) 10 MG tablet Take 1 tablet (10 mg total) by mouth 2 (two) times daily as needed for anxiety. 30 tablet 0   Ibuprofen 200 MG CAPS Take by mouth as needed.     IRON PO Take 65 mg by mouth daily.     topiramate (TOPAMAX) 50 MG tablet Take 1 tablet (50 mg total) by mouth at bedtime. For headache prevention 90 tablet 0   No current facility-administered medications on file prior to visit.    BP 122/82   Pulse 73   Temp 98.2 F (36.8 C) (Oral)   Ht 5' 2.5" (1.588 m)   Wt 207 lb 4 oz (94 kg)   LMP 01/06/2020 Comment: had a tubal  SpO2 100%   BMI 37.30 kg/m  Objective:   Physical Exam HENT:     Right Ear: Tympanic membrane and ear canal normal.     Left Ear: Tympanic membrane and ear canal normal.  Eyes:     Pupils: Pupils are equal, round, and reactive to light.  Cardiovascular:     Rate and Rhythm: Normal rate and regular rhythm.  Pulmonary:     Effort: Pulmonary effort is normal.     Breath sounds: Normal breath sounds.  Abdominal:     General: Bowel sounds are normal.     Palpations: Abdomen is soft.     Tenderness: There is no abdominal tenderness.  Musculoskeletal:        General: Normal range of motion.     Cervical back: Neck supple.  Skin:    General: Skin is warm and dry.  Neurological:     Mental Status: She is alert and oriented to person,  place, and time.     Cranial Nerves: No cranial nerve deficit.     Deep Tendon Reflexes:     Reflex Scores:      Patellar reflexes are 2+  on the right side and 2+ on the left side. Psychiatric:        Mood and Affect: Mood normal.           Assessment & Plan:  Encounter for annual general medical examination with abnormal findings in adult Assessment & Plan: Immunizations UTD. Influenza vaccine provided today.  Pap smear UTD. Mammogram due, orders placed. Colonoscopy UTD, due 2031  Discussed the importance of a healthy diet and regular exercise in order for weight loss, and to reduce the risk of further co-morbidity.  Exam stable. Labs pending.  Follow up in 1 year for repeat physical.    Encounter for immunization -     Flu vaccine trivalent PF, 6mos and older(Flulaval,Afluria,Fluarix,Fluzone)  RUQ pain Assessment & Plan: Continued.  Reviewed RUQ ultrasound which reveals gallstones.  Referral placed to general surgery.   Orders: -     Ambulatory referral to General Surgery  Primary hypertension Assessment & Plan: Controlled.  Continue hydrochlorothiazide 25 mg daily. Reviewed CMP from last week.    Constipation, unspecified constipation type Assessment & Plan: Controlled.  Continue Miralax PRN.   Migraine without aura and without status migrainosus, not intractable Assessment & Plan: Controlled.  Continue Topamax 50 mg HS.   Hyperlipidemia, unspecified hyperlipidemia type Assessment & Plan: Repeat lipid panel pending.  Discussed the importance of a healthy diet and regular exercise in order for weight loss, and to reduce the risk of further co-morbidity.   Orders: -     Lipid panel  GAD (generalized anxiety disorder) Assessment & Plan: Uncontrolled.  Zoloft caused drowsiness.  Start Lexapro 5 mg daily. She will update.   Orders: -     Escitalopram Oxalate; Take 1 tablet (5 mg total) by mouth daily. For anxiety  Dispense: 90  tablet; Refill: 0  Screening mammogram for breast cancer -     3D Screening Mammogram, Left and Right; Future  Vitamin D deficiency -     VITAMIN D 25 Hydroxy (Vit-D Deficiency, Fractures)        Doreene Nest, NP

## 2023-11-05 NOTE — Assessment & Plan Note (Signed)
Repeat lipid panel pending.  Discussed the importance of a healthy diet and regular exercise in order for weight loss, and to reduce the risk of further co-morbidity.  

## 2023-11-05 NOTE — Assessment & Plan Note (Signed)
Uncontrolled.  Zoloft caused drowsiness.  Start Lexapro 5 mg daily. She will update.

## 2023-11-05 NOTE — Assessment & Plan Note (Signed)
Controlled.  Continue hydrochlorothiazide 25 mg daily. Reviewed CMP from last week.

## 2023-11-05 NOTE — Assessment & Plan Note (Signed)
Controlled.  Continue Miralax PRN.

## 2023-11-05 NOTE — Assessment & Plan Note (Signed)
Immunizations UTD. Influenza vaccine provided today.  Pap smear UTD. Mammogram due, orders placed. Colonoscopy UTD, due 2031  Discussed the importance of a healthy diet and regular exercise in order for weight loss, and to reduce the risk of further co-morbidity.  Exam stable. Labs pending.  Follow up in 1 year for repeat physical.

## 2023-11-05 NOTE — Assessment & Plan Note (Signed)
Continued.  Reviewed RUQ ultrasound which reveals gallstones.  Referral placed to general surgery.

## 2023-11-05 NOTE — Patient Instructions (Signed)
Stop by the lab prior to leaving today. I will notify you of your results once received.   You will either be contacted via phone regarding your referral to general surgery, or you may receive a letter on your MyChart portal from our referral team with instructions for scheduling an appointment. Please let us know if you have not been contacted by anyone within two weeks.  Start Lexapro 5 mg once daily for anxiety.  Please update me in 1 month!  It was a pleasure to see you today!

## 2023-11-05 NOTE — Assessment & Plan Note (Signed)
Controlled.  Continue Topamax 50 mg HS.

## 2023-11-09 NOTE — Progress Notes (Unsigned)
Patient ID: [name], female   DOB: Apr 15, 1971, 52 y.o.   MRN: [id number]  Chief Complaint: Right upper quadrant pain  History of Present Illness [name] is a 52 y.o. female with a 3+ month history of postprandial right upper quadrant pain.  She denies any radiation or back pain.  She reports it has been progressive with increased frequency and increased duration of her symptoms.  She reports that episodes now last over 2 hours after eating.  She has a history of gastric bypass with Roux-en-Y.  Ibuprofen and Gas-X has not helped.  She reports constipation and nausea, denies vomiting, fever, chills, diarrhea, pyrosis, chest pain, urinary frequency or urgency.  Past Medical History Past Medical History:  Diagnosis Date   Acute sinusitis 01/30/2023   Allergy    Anxiety    Headache(784.0)    MIGRAINES   History of chickenpox    History of migraine    Hypertension    Morbid obesity (HCC)    OSA on CPAP    Sleep apnea    no CPAP needed now      Past Surgical History:  Procedure Laterality Date   CESAREAN SECTION  05/2005   GASTRIC ROUX-EN-Y N/A 02/27/2014   Procedure: LAPAROSCOPIC ROUX-EN-Y GASTRIC BYPASS WITH UPPER ENDOSCOPY ;  Surgeon: Atilano Ina, MD;  Location: [facility];  Service: General;  Laterality: N/A;   THERAPEUTIC ABORTION  1988   TUBAL LIGATION  2011    Allergies  Allergen Reactions   Latex     irritation   Lisinopril Hives, Itching and Swelling    Current Outpatient Medications  Medication Sig Dispense Refill   Calcium Carbonate-Vitamin D (CALTRATE 600+D PO) Take 1 tablet by mouth 3 (three) times daily.     cetirizine (ZYRTEC) 10 MG tablet Take 10 mg by mouth daily.     escitalopram (LEXAPRO) 5 MG tablet Take 1 tablet (5 mg total) by mouth daily. For anxiety (Patient taking differently: Take 5 mg by mouth at bedtime. For anxiety) 90 tablet 0   hydrochlorothiazide (HYDRODIURIL) 25 MG tablet TAKE 1 TABLET BY MOUTH EVERY DAY for blood pressure. 90  tablet 2   hydrOXYzine (ATARAX) 10 MG tablet Take 1 tablet (10 mg total) by mouth 2 (two) times daily as needed for anxiety. 30 tablet 0   ibuprofen (ADVIL) 200 MG tablet Take 200 mg by mouth every 8 (eight) hours as needed (headaches/migraines).     topiramate (TOPAMAX) 50 MG tablet Take 1 tablet (50 mg total) by mouth at bedtime. For headache prevention 90 tablet 0   ferrous sulfate 325 (65 FE) MG tablet Take 325 mg by mouth daily with breakfast.     polyethylene glycol (MIRALAX / GLYCOLAX) 17 g packet Take 17 g by mouth daily as needed for moderate constipation.     No current facility-administered medications for this visit.    Family History Family History  Problem Relation Age of Onset   Cancer Mother        lung   Hypertension Mother    Diabetes Mother    Emphysema Father    Heart failure Father    Cancer Maternal Aunt        breast   Colon cancer Neg Hx    Esophageal cancer Neg Hx    Rectal cancer Neg Hx    Stomach cancer Neg Hx       Social History Social History   Tobacco Use   Smoking status: Never  Passive exposure: Never   Smokeless tobacco: Never  Vaping Use   Vaping status: Never Used  Substance Use Topics   Alcohol use: Yes    Alcohol/week: 0.0 standard drinks of alcohol    Comment: occasional glass of wine or mixed drink 2 or 3 times a month   Drug use: No        Review of Systems  Constitutional:  Negative for chills and fever.  HENT: Negative.    Eyes: Negative.   Respiratory:  Negative for cough, shortness of breath and wheezing.   Cardiovascular:  Negative for chest pain.  Gastrointestinal:  Positive for abdominal pain, constipation, nausea and vomiting.  Genitourinary: Negative.   Skin:  Negative for rash.  Neurological:  Positive for headaches.  Psychiatric/Behavioral:  The patient is nervous/anxious.      Physical Exam Blood pressure 113/78, pulse 73, temperature 98 F (36.7 C), height 5\' 2"  (1.575 m), weight 204 lb (92.5 kg),  last menstrual period 01/06/2020, SpO2 99%. Last Weight  Most recent update: 11/10/2023  3:05 PM    Weight  92.5 kg (204 lb)             CONSTITUTIONAL: Well developed, and nourished, appropriately responsive and aware without distress.   EYES: Sclera non-icteric.   EARS, NOSE, MOUTH AND THROAT:  The oropharynx is clear. Oral mucosa is pink and moist.   Hearing is intact to voice.  NECK: Trachea is midline, and there is no jugular venous distension.  LYMPH NODES:  Lymph nodes in the neck are not appreciated. RESPIRATORY:  Lungs are clear, and breath sounds are equal bilaterally.  Normal respiratory effort without pathologic use of accessory muscles. CARDIOVASCULAR: Heart is regular in rate and rhythm.   Well perfused.  GI: The abdomen is  soft, nontender, and nondistended. There were no palpable masses.  I did not appreciate hepatosplenomegaly.  MUSCULOSKELETAL:  Symmetrical muscle tone appreciated in all four extremities.    SKIN: Skin turgor is normal. No pathologic skin lesions appreciated.  NEUROLOGIC:  Motor and sensation appear grossly normal.  Cranial nerves are grossly without defect. PSYCH:  Alert and oriented to person, place and time. Affect is appropriate for situation.  Data Reviewed I have personally reviewed what is currently available of the patient's imaging, recent labs and medical records.   Labs:     Latest Ref Rng & Units 10/27/2023   12:31 PM 06/20/2021    8:57 AM 12/26/2019    8:31 AM  CBC  WBC 4.0 - 10.5 K/uL 5.2  5.6  7.3   Hemoglobin 12.0 - 15.0 g/dL 57.8  46.9  62.9   Hematocrit 36.0 - 46.0 % 41.5  42.3  42.0   Platelets 150.0 - 400.0 K/uL 271.0  305.0  288.0       Latest Ref Rng & Units 10/27/2023   12:31 PM 06/25/2022    8:09 AM 06/20/2021    8:57 AM  CMP  Glucose 70 - 99 mg/dL 87  93  89   BUN 6 - 23 mg/dL 11  17  17    Creatinine 0.40 - 1.20 mg/dL 5.28  4.13  2.44   Sodium 135 - 145 mEq/L 139  138  139   Potassium 3.5 - 5.1 mEq/L 4.3  3.5  3.8    Chloride 96 - 112 mEq/L 103  104  101   CO2 19 - 32 mEq/L 29  25  29    Calcium 8.4 - 10.5 mg/dL 01.0  9.6  27.2  Total Protein 6.0 - 8.3 g/dL 7.5  7.4  8.0   Total Bilirubin 0.2 - 1.2 mg/dL 0.4  0.4  0.5   Alkaline Phos 39 - 117 U/L 73  73  68   AST 0 - 37 U/L 17  14  15    ALT 0 - 35 U/L 11  10  9      Imaging: Radiological images reviewed:  CLINICAL DATA:  Right upper quadrant abdominal pain for 6-8 weeks   EXAM: ULTRASOUND ABDOMEN LIMITED RIGHT UPPER QUADRANT   COMPARISON:  02/16/2013 right upper quadrant ultrasound   FINDINGS: Gallbladder:   Multiple echogenic stones fill the gallbladder, the largest of which measures up to 1.0 cm. No wall thickening or pericholecystic fluid. No sonographic Murphy sign noted by sonographer.   Common bile duct:   Diameter: 2 mm, within normal limits. No intrahepatic biliary ductal dilatation.   Liver:   No focal lesion identified. Increased parenchymal echogenicity. Portal vein is patent on color Doppler imaging with normal direction of blood flow towards the liver.   Other: None.   IMPRESSION: 1. Cholelithiasis without sonographic evidence of acute cholecystitis. 2. Hepatic steatosis.     Electronically Signed   By: Wiliam Ke M.D.   On: 10/29/2023 11:19 Within last 24 hrs: No results found.  Assessment    Chronic calculus cholecystitis with biliary colic  Patient Active Problem List   Diagnosis Date Noted   RUQ pain 10/27/2023   Thoracic back pain 10/27/2023   Abnormal vaginal bleeding in premenopausal patient 06/20/2021   Constipation 12/03/2018   Vitamin D deficiency 12/03/2018   GAD (generalized anxiety disorder) 11/24/2017   Hyperlipidemia 07/17/2016   Encounter for annual general medical examination with abnormal findings in adult 07/17/2016   Migraines 04/16/2016   S/P gastric bypass 03/01/2014   HTN (hypertension) 03/01/2014   Obesity (BMI 30-39.9) 02/27/2014    Plan    This was discussed  thoroughly.  Optimal plan is for robotic cholecystectomy utilizing ICG imaging. Risks and benefits have been discussed with the patient which include but are not limited to anesthesia, bleeding, infection, biliary ductal injury, resulting in leak or stenosis, other associated unanticipated injuries affiliated with laparoscopic surgery.   Reviewed that removing the gallbladder will only address the symptoms related to the gallbladder itself.  I believe there is the desire to proceed, accepting the risks with understanding.  Questions elicited and answered to satisfaction.    No guarantees ever expressed or implied.    Face-to-face time spent with the patient and accompanying care providers(if present) was 35 minutes, with more than 50% of the time spent counseling, educating, and coordinating care of the patient.    These notes generated with voice recognition software. I apologize for typographical errors.  Campbell Lerner M.D., FACS 11/11/2023, 12:36 PM

## 2023-11-09 NOTE — H&P (View-Only) (Signed)
Patient ID: [name], female   DOB: 1971/12/05, 52 y.o.   MRN: [id number]  Chief Complaint: Right upper quadrant pain  History of Present Illness [name] is a 52 y.o. female with a 3+ month history of postprandial right upper quadrant pain.  She denies any radiation or back pain.  She reports it has been progressive with increased frequency and increased duration of her symptoms.  She reports that episodes now last over 2 hours after eating.  She has a history of gastric bypass with Roux-en-Y.  Ibuprofen and Gas-X has not helped.  She reports constipation and nausea, denies vomiting, fever, chills, diarrhea, pyrosis, chest pain, urinary frequency or urgency.  Past Medical History Past Medical History:  Diagnosis Date   Acute sinusitis 01/30/2023   Allergy    Anxiety    Headache(784.0)    MIGRAINES   History of chickenpox    History of migraine    Hypertension    Morbid obesity (HCC)    OSA on CPAP    Sleep apnea    no CPAP needed now      Past Surgical History:  Procedure Laterality Date   CESAREAN SECTION  05/2005   GASTRIC ROUX-EN-Y N/A 02/27/2014   Procedure: LAPAROSCOPIC ROUX-EN-Y GASTRIC BYPASS WITH UPPER ENDOSCOPY ;  Surgeon: Atilano Ina, MD;  Location: [facility];  Service: General;  Laterality: N/A;   THERAPEUTIC ABORTION  1988   TUBAL LIGATION  2011    Allergies  Allergen Reactions   Latex     irritation   Lisinopril Hives, Itching and Swelling    Current Outpatient Medications  Medication Sig Dispense Refill   Calcium Carbonate-Vitamin D (CALTRATE 600+D PO) Take 1 tablet by mouth 3 (three) times daily.     cetirizine (ZYRTEC) 10 MG tablet Take 10 mg by mouth daily.     escitalopram (LEXAPRO) 5 MG tablet Take 1 tablet (5 mg total) by mouth daily. For anxiety (Patient taking differently: Take 5 mg by mouth at bedtime. For anxiety) 90 tablet 0   hydrochlorothiazide (HYDRODIURIL) 25 MG tablet TAKE 1 TABLET BY MOUTH EVERY DAY for blood pressure. 90  tablet 2   hydrOXYzine (ATARAX) 10 MG tablet Take 1 tablet (10 mg total) by mouth 2 (two) times daily as needed for anxiety. 30 tablet 0   ibuprofen (ADVIL) 200 MG tablet Take 200 mg by mouth every 8 (eight) hours as needed (headaches/migraines).     topiramate (TOPAMAX) 50 MG tablet Take 1 tablet (50 mg total) by mouth at bedtime. For headache prevention 90 tablet 0   ferrous sulfate 325 (65 FE) MG tablet Take 325 mg by mouth daily with breakfast.     polyethylene glycol (MIRALAX / GLYCOLAX) 17 g packet Take 17 g by mouth daily as needed for moderate constipation.     No current facility-administered medications for this visit.    Family History Family History  Problem Relation Age of Onset   Cancer Mother        lung   Hypertension Mother    Diabetes Mother    Emphysema Father    Heart failure Father    Cancer Maternal Aunt        breast   Colon cancer Neg Hx    Esophageal cancer Neg Hx    Rectal cancer Neg Hx    Stomach cancer Neg Hx       Social History Social History   Tobacco Use   Smoking status: Never  Passive exposure: Never   Smokeless tobacco: Never  Vaping Use   Vaping status: Never Used  Substance Use Topics   Alcohol use: Yes    Alcohol/week: 0.0 standard drinks of alcohol    Comment: occasional glass of wine or mixed drink 2 or 3 times a month   Drug use: No        Review of Systems  Constitutional:  Negative for chills and fever.  HENT: Negative.    Eyes: Negative.   Respiratory:  Negative for cough, shortness of breath and wheezing.   Cardiovascular:  Negative for chest pain.  Gastrointestinal:  Positive for abdominal pain, constipation, nausea and vomiting.  Genitourinary: Negative.   Skin:  Negative for rash.  Neurological:  Positive for headaches.  Psychiatric/Behavioral:  The patient is nervous/anxious.      Physical Exam Blood pressure 113/78, pulse 73, temperature 98 F (36.7 C), height 5\' 2"  (1.575 m), weight 204 lb (92.5 kg),  last menstrual period 01/06/2020, SpO2 99%. Last Weight  Most recent update: 11/10/2023  3:05 PM    Weight  92.5 kg (204 lb)             CONSTITUTIONAL: Well developed, and nourished, appropriately responsive and aware without distress.   EYES: Sclera non-icteric.   EARS, NOSE, MOUTH AND THROAT:  The oropharynx is clear. Oral mucosa is pink and moist.   Hearing is intact to voice.  NECK: Trachea is midline, and there is no jugular venous distension.  LYMPH NODES:  Lymph nodes in the neck are not appreciated. RESPIRATORY:  Lungs are clear, and breath sounds are equal bilaterally.  Normal respiratory effort without pathologic use of accessory muscles. CARDIOVASCULAR: Heart is regular in rate and rhythm.   Well perfused.  GI: The abdomen is  soft, nontender, and nondistended. There were no palpable masses.  I did not appreciate hepatosplenomegaly.  MUSCULOSKELETAL:  Symmetrical muscle tone appreciated in all four extremities.    SKIN: Skin turgor is normal. No pathologic skin lesions appreciated.  NEUROLOGIC:  Motor and sensation appear grossly normal.  Cranial nerves are grossly without defect. PSYCH:  Alert and oriented to person, place and time. Affect is appropriate for situation.  Data Reviewed I have personally reviewed what is currently available of the patient's imaging, recent labs and medical records.   Labs:     Latest Ref Rng & Units 10/27/2023   12:31 PM 06/20/2021    8:57 AM 12/26/2019    8:31 AM  CBC  WBC 4.0 - 10.5 K/uL 5.2  5.6  7.3   Hemoglobin 12.0 - 15.0 g/dL 16.1  09.6  04.5   Hematocrit 36.0 - 46.0 % 41.5  42.3  42.0   Platelets 150.0 - 400.0 K/uL 271.0  305.0  288.0       Latest Ref Rng & Units 10/27/2023   12:31 PM 06/25/2022    8:09 AM 06/20/2021    8:57 AM  CMP  Glucose 70 - 99 mg/dL 87  93  89   BUN 6 - 23 mg/dL 11  17  17    Creatinine 0.40 - 1.20 mg/dL 4.09  8.11  9.14   Sodium 135 - 145 mEq/L 139  138  139   Potassium 3.5 - 5.1 mEq/L 4.3  3.5  3.8    Chloride 96 - 112 mEq/L 103  104  101   CO2 19 - 32 mEq/L 29  25  29    Calcium 8.4 - 10.5 mg/dL 78.2  9.6  95.6  Total Protein 6.0 - 8.3 g/dL 7.5  7.4  8.0   Total Bilirubin 0.2 - 1.2 mg/dL 0.4  0.4  0.5   Alkaline Phos 39 - 117 U/L 73  73  68   AST 0 - 37 U/L 17  14  15    ALT 0 - 35 U/L 11  10  9      Imaging: Radiological images reviewed:  CLINICAL DATA:  Right upper quadrant abdominal pain for 6-8 weeks   EXAM: ULTRASOUND ABDOMEN LIMITED RIGHT UPPER QUADRANT   COMPARISON:  02/16/2013 right upper quadrant ultrasound   FINDINGS: Gallbladder:   Multiple echogenic stones fill the gallbladder, the largest of which measures up to 1.0 cm. No wall thickening or pericholecystic fluid. No sonographic Murphy sign noted by sonographer.   Common bile duct:   Diameter: 2 mm, within normal limits. No intrahepatic biliary ductal dilatation.   Liver:   No focal lesion identified. Increased parenchymal echogenicity. Portal vein is patent on color Doppler imaging with normal direction of blood flow towards the liver.   Other: None.   IMPRESSION: 1. Cholelithiasis without sonographic evidence of acute cholecystitis. 2. Hepatic steatosis.     Electronically Signed   By: Wiliam Ke M.D.   On: 10/29/2023 11:19 Within last 24 hrs: No results found.  Assessment    Chronic calculus cholecystitis with biliary colic  Patient Active Problem List   Diagnosis Date Noted   RUQ pain 10/27/2023   Thoracic back pain 10/27/2023   Abnormal vaginal bleeding in premenopausal patient 06/20/2021   Constipation 12/03/2018   Vitamin D deficiency 12/03/2018   GAD (generalized anxiety disorder) 11/24/2017   Hyperlipidemia 07/17/2016   Encounter for annual general medical examination with abnormal findings in adult 07/17/2016   Migraines 04/16/2016   S/P gastric bypass 03/01/2014   HTN (hypertension) 03/01/2014   Obesity (BMI 30-39.9) 02/27/2014    Plan    This was discussed  thoroughly.  Optimal plan is for robotic cholecystectomy utilizing ICG imaging. Risks and benefits have been discussed with the patient which include but are not limited to anesthesia, bleeding, infection, biliary ductal injury, resulting in leak or stenosis, other associated unanticipated injuries affiliated with laparoscopic surgery.   Reviewed that removing the gallbladder will only address the symptoms related to the gallbladder itself.  I believe there is the desire to proceed, accepting the risks with understanding.  Questions elicited and answered to satisfaction.    No guarantees ever expressed or implied.    Face-to-face time spent with the patient and accompanying care providers(if present) was 35 minutes, with more than 50% of the time spent counseling, educating, and coordinating care of the patient.    These notes generated with voice recognition software. I apologize for typographical errors.  Campbell Lerner M.D., FACS 11/11/2023, 12:36 PM

## 2023-11-10 ENCOUNTER — Ambulatory Visit: Payer: BC Managed Care – PPO | Admitting: Surgery

## 2023-11-10 ENCOUNTER — Encounter: Payer: Self-pay | Admitting: Surgery

## 2023-11-10 ENCOUNTER — Ambulatory Visit: Payer: Self-pay | Admitting: Surgery

## 2023-11-10 VITALS — BP 113/78 | HR 73 | Temp 98.0°F | Ht 62.0 in | Wt 204.0 lb

## 2023-11-10 DIAGNOSIS — K8010 Calculus of gallbladder with chronic cholecystitis without obstruction: Secondary | ICD-10-CM

## 2023-11-10 NOTE — Patient Instructions (Signed)
You have requested to have your gallbladder removed. This will be done at [facility] with Dr. Rodenberg.  You will most likely be out of work 1-2 weeks for this surgery.  If you have FMLA or disability paperwork that needs filled out you may drop this off at our office or this can be faxed to [phone].  You will return after your post-op appointment with a lifting restriction for approximately 4 more weeks.  You will be able to eat anything you would like to following surgery. But, start by eating a bland diet and advance this as tolerated. The Gallbladder diet is below, please go as closely by this diet as possible prior to surgery to avoid any further attacks.  Please see the (blue)pre-care form that you have been given today. Our surgery scheduler will call you to verify surgery date and to go over information.   If you have any questions, please call our office.  Laparoscopic Cholecystectomy Laparoscopic cholecystectomy is surgery to remove the gallbladder. The gallbladder is located in the upper right part of the abdomen, behind the liver. It is a storage sac for bile, which is produced in the liver. Bile aids in the digestion and absorption of fats. Cholecystectomy is often done for inflammation of the gallbladder (cholecystitis). This condition is usually caused by a buildup of gallstones (cholelithiasis) in the gallbladder. Gallstones can block the flow of bile, and that can result in inflammation and pain. In severe cases, emergency surgery may be required. If emergency surgery is not required, you will have time to prepare for the procedure. Laparoscopic surgery is an alternative to open surgery. Laparoscopic surgery has a shorter recovery time. Your common bile duct may also need to be examined during the procedure. If stones are found in the common bile duct, they may be removed. LET YOUR HEALTH CARE PROVIDER KNOW ABOUT: Any allergies you have. All medicines you are taking,  including vitamins, herbs, eye drops, creams, and over-the-counter medicines. Previous problems you or members of your family have had with the use of anesthetics. Any blood disorders you have. Previous surgeries you have had.  Any medical conditions you have. RISKS AND COMPLICATIONS Generally, this is a safe procedure. However, problems may occur, including: Infection. Bleeding. Allergic reactions to medicines. Damage to other structures or organs. A stone remaining in the common bile duct. A bile leak from the cyst duct that is clipped when your gallbladder is removed. The need to convert to open surgery, which requires a larger incision in the abdomen. This may be necessary if your surgeon thinks that it is not safe to continue with a laparoscopic procedure. BEFORE THE PROCEDURE Ask your health care provider about: Changing or stopping your regular medicines. This is especially important if you are taking diabetes medicines or blood thinners. Taking medicines such as aspirin and ibuprofen. These medicines can thin your blood. Do not take these medicines before your procedure if your health care provider instructs you not to. Follow instructions from your health care provider about eating or drinking restrictions. Let your health care provider know if you develop a cold or an infection before surgery. Plan to have someone take you home after the procedure. Ask your health care provider how your surgical site will be marked or identified. You may be given antibiotic medicine to help prevent infection. PROCEDURE To reduce your risk of infection: Your health care team will wash or sanitize their hands. Your skin will be washed with soap. An IV   tube may be inserted into one of your veins. You will be given a medicine to make you fall asleep (general anesthetic). A breathing tube will be placed in your mouth. The surgeon will make several small cuts (incisions) in your abdomen. A thin,  lighted tube (laparoscope) that has a tiny camera on the end will be inserted through one of the small incisions. The camera on the laparoscope will send a picture to a TV screen (monitor) in the operating room. This will give the surgeon a good view inside your abdomen. A gas will be pumped into your abdomen. This will expand your abdomen to give the surgeon more room to perform the surgery. Other tools that are needed for the procedure will be inserted through the other incisions. The gallbladder will be removed through one of the incisions. After your gallbladder has been removed, the incisions will be closed with stitches (sutures), staples, or skin glue. Your incisions may be covered with a bandage (dressing). The procedure may vary among health care providers and hospitals. AFTER THE PROCEDURE Your blood pressure, heart rate, breathing rate, and blood oxygen level will be monitored often until the medicines you were given have worn off. You will be given medicines as needed to control your pain.   This information is not intended to replace advice given to you by your health care provider. Make sure you discuss any questions you have with your health care provider.   Document Released: 12/15/2005 Document Revised: 09/05/2015 Document Reviewed: 07/27/2013 Elsevier Interactive Patient Education 2016 [employer]   Low-Fat Diet for Gallbladder Conditions A low-fat diet can be helpful if you have pancreatitis or a gallbladder condition. With these conditions, your pancreas and gallbladder have trouble digesting fats. A healthy eating plan with less fat will help rest your pancreas and gallbladder and reduce your symptoms. WHAT DO I NEED TO KNOW ABOUT THIS DIET? Eat a low-fat diet. Reduce your fat intake to less than 20-30% of your total daily calories. This is less than 50-60 g of fat per day. Remember that you need some fat in your diet. Ask your dietician what your daily goal should  be. Choose nonfat and low-fat healthy foods. Look for the words "nonfat," "low fat," or "fat free." As a guide, look on the label and choose foods with less than 3 g of fat per serving. Eat only one serving. Avoid alcohol. Do not smoke. If you need help quitting, talk with your health care provider. Eat small frequent meals instead of three large heavy meals. WHAT FOODS CAN I EAT? Grains Include healthy grains and starches such as potatoes, wheat bread, fiber-rich cereal, and brown rice. Choose whole grain options whenever possible. In adults, whole grains should account for 45-65% of your daily calories.  Fruits and Vegetables Eat plenty of fruits and vegetables. Fresh fruits and vegetables add fiber to your diet. Meats and Other Protein Sources Eat lean meat such as chicken and pork. Trim any fat off of meat before cooking it. Eggs, fish, and beans are other sources of protein. In adults, these foods should account for 10-35% of your daily calories. Dairy Choose low-fat milk and dairy options. Dairy includes fat and protein, as well as calcium.  Fats and Oils Limit high-fat foods such as fried foods, sweets, baked goods, sugary drinks.  Other Creamy sauces and condiments, such as mayonnaise, can add extra fat. Think about whether or not you need to use them, or use smaller amounts or low fat options.   WHAT FOODS ARE NOT RECOMMENDED? High fat foods, such as: Baked goods. Ice cream. French toast. Sweet rolls. Pizza. Cheese bread. Foods covered with batter, butter, creamy sauces, or cheese. Fried foods. Sugary drinks and desserts. Foods that cause gas or bloating   This information is not intended to replace advice given to you by your health care provider. Make sure you discuss any questions you have with your health care provider.   Document Released: 12/20/2013 Document Reviewed: 12/20/2013 Elsevier Interactive Patient Education 2016 Elsevier Inc.   

## 2023-11-11 ENCOUNTER — Telehealth: Payer: Self-pay | Admitting: Surgery

## 2023-11-11 ENCOUNTER — Ambulatory Visit: Payer: Self-pay | Admitting: Surgery

## 2023-11-11 NOTE — Telephone Encounter (Signed)
Patient has been advised of Pre-Admission date/time, and Surgery date at [facility].  Surgery Date: 11/18/23 Preadmission Testing Date: 11/12/23 (phone 8a-1p)  Patient has been made aware to call [phone], between 1-3:00pm the day before surgery, to find out what time to arrive for surgery.

## 2023-11-12 ENCOUNTER — Encounter
Admission: RE | Admit: 2023-11-12 | Discharge: 2023-11-12 | Disposition: A | Discharge status: Home / Self Care | Payer: BC Managed Care – PPO | Source: Ambulatory Visit | Attending: Surgery | Admitting: Surgery

## 2023-11-12 DIAGNOSIS — Z01810 Encounter for preprocedural cardiovascular examination: Secondary | ICD-10-CM

## 2023-11-12 DIAGNOSIS — I10 Essential (primary) hypertension: Secondary | ICD-10-CM

## 2023-11-12 HISTORY — DX: Migraine, unspecified, not intractable, without status migrainosus: G43.909

## 2023-11-12 HISTORY — DX: Fatty (change of) liver, not elsewhere classified: K76.0

## 2023-11-12 HISTORY — DX: Gastro-esophageal reflux disease without esophagitis: K21.9

## 2023-11-12 HISTORY — DX: Calculus of gallbladder with chronic cholecystitis without obstruction: K80.10

## 2023-11-12 HISTORY — DX: Vitamin D deficiency, unspecified: E55.9

## 2023-11-12 HISTORY — DX: Hyperlipidemia, unspecified: E78.5

## 2023-11-12 HISTORY — DX: Bariatric surgery status: Z98.84

## 2023-11-12 NOTE — Patient Instructions (Signed)
Your procedure is scheduled on:11-18-23 Wednesday Report to the Registration Desk on the 1st floor of the [facility].Then proceed to the 2nd floor Surgery Desk To find out your arrival time, please call [phone] between 1PM - 3PM on:11-17-23 Tuesday If your arrival time is 6:00 am, do not arrive before that time as the [facility] entrance doors do not open until 6:00 am.  REMEMBER: Instructions that are not followed completely may result in serious medical risk, up to and including death; or upon the discretion of your surgeon and anesthesiologist your surgery may need to be rescheduled.  Do not eat food OR drink any liquids after midnight the night before surgery.  No gum chewing or hard candies.  One week prior to surgery:Stop NOW (11-12-23) Stop Anti-inflammatories (NSAIDS) such as Advil, Aleve, Ibuprofen, Motrin, Naproxen, Naprosyn and Aspirin based products such as Excedrin, Goody's Powder, BC Powder. Stop ANY OVER THE COUNTER supplements until after surgery (Calcium + D, Ferrous Sulfate)  You may however, continue to take Tylenol if needed for pain up until the day of surgery.  Continue taking all of your other prescription medications up until the day of surgery.  ON THE DAY OF SURGERY ONLY TAKE THESE MEDICATIONS WITH SIPS OF WATER: -You may take hydrOXYzine (ATARAX) if needed for anxiety  No Alcohol for 24 hours before or after surgery.  No Smoking including e-cigarettes for 24 hours before surgery.  No chewable tobacco products for at least 6 hours before surgery.  No nicotine patches on the day of surgery.  Do not use any "recreational" drugs for at least a week (preferably 2 weeks) before your surgery.  Please be advised that the combination of cocaine and anesthesia may have negative outcomes, up to and including death. If you test positive for cocaine, your surgery will be cancelled.  On the morning of surgery brush your teeth with toothpaste and water, you may  rinse your mouth with mouthwash if you wish. Do not swallow any toothpaste or mouthwash.  Use CHG Soap as directed on instruction sheet.  Do not wear jewelry, make-up, hairpins, clips or nail polish.  For welded (permanent) jewelry: bracelets, anklets, waist bands, etc.  Please have this removed prior to surgery.  If it is not removed, there is a chance that hospital personnel will need to cut it off on the day of surgery.  Do not wear lotions, powders, or perfumes.   Do not shave body hair from the neck down 48 hours before surgery.  Contact lenses, hearing aids and dentures may not be worn into surgery.  Do not bring valuables to the hospital. [facility] is not responsible for any missing/lost belongings or valuables.   Notify your doctor if there is any change in your medical condition (cold, fever, infection).  Wear comfortable clothing (specific to your surgery type) to the hospital.  After surgery, you can help prevent lung complications by doing breathing exercises.  Take deep breaths and cough every 1-2 hours. Your doctor may order a device called an Incentive Spirometer to help you take deep breaths. When coughing or sneezing, hold a pillow firmly against your incision with both hands. This is called "splinting." Doing this helps protect your incision. It also decreases belly discomfort.  If you are being admitted to the hospital overnight, leave your suitcase in the car. After surgery it may be brought to your room.  In case of increased patient census, it may be necessary for you, the patient, to continue  your postoperative care in the Same Day Surgery department.  If you are being discharged the day of surgery, you will not be allowed to drive home. You will need a responsible individual to drive you home and stay with you for 24 hours after surgery.   If you are taking public transportation, you will need to have a responsible individual with you.  Please call the  Pre-admissions Testing Dept. at [phone] if you have any questions about these instructions.  Surgery Visitation Policy:  Patients having surgery or a procedure may have two visitors.  Children under the age of 33 must have an adult with them who is not the patient.     Preparing for Surgery with CHLORHEXIDINE GLUCONATE (CHG) Soap  Chlorhexidine Gluconate (CHG) Soap  o An antiseptic cleaner that kills germs and bonds with the skin to continue killing germs even after washing  o Used for showering the night before surgery and morning of surgery  Before surgery, you can play an important role by reducing the number of germs on your skin.  CHG (Chlorhexidine gluconate) soap is an antiseptic cleanser which kills germs and bonds with the skin to continue killing germs even after washing.  Please do not use if you have an allergy to CHG or antibacterial soaps. If your skin becomes reddened/irritated stop using the CHG.  1. Shower the NIGHT BEFORE SURGERY and the MORNING OF SURGERY with CHG soap.  2. If you choose to wash your hair, wash your hair first as usual with your normal shampoo.  3. After shampooing, rinse your hair and body thoroughly to remove the shampoo.  4. Use CHG as you would any other liquid soap. You can apply CHG directly to the skin and wash gently with a scrungie or a clean washcloth.  5. Apply the CHG soap to your body only from the neck down. Do not use on open wounds or open sores. Avoid contact with your eyes, ears, mouth, and genitals (private parts). Wash face and genitals (private parts) with your normal soap.  6. Wash thoroughly, paying special attention to the area where your surgery will be performed.  7. Thoroughly rinse your body with warm water.  8. Do not shower/wash with your normal soap after using and rinsing off the CHG soap.  9. Pat yourself dry with a clean towel.  10. Wear clean pajamas to bed the night before surgery.  12. Place  clean sheets on your bed the night of your first shower and do not sleep with pets.  13. Shower again with the CHG soap on the day of surgery prior to arriving at the hospital.  14. Do not apply any deodorants/lotions/powders.  15. Please wear clean clothes to the hospital.

## 2023-11-16 ENCOUNTER — Encounter
Admission: RE | Admit: 2023-11-16 | Discharge: 2023-11-16 | Disposition: A | Discharge status: Home / Self Care | Payer: BC Managed Care – PPO | Source: Ambulatory Visit | Attending: Surgery | Admitting: Surgery

## 2023-11-16 ENCOUNTER — Telehealth: Payer: Self-pay | Admitting: *Deleted

## 2023-11-16 DIAGNOSIS — G4733 Obstructive sleep apnea (adult) (pediatric): Secondary | ICD-10-CM | POA: Diagnosis not present

## 2023-11-16 DIAGNOSIS — Z01810 Encounter for preprocedural cardiovascular examination: Secondary | ICD-10-CM | POA: Diagnosis not present

## 2023-11-16 DIAGNOSIS — I10 Essential (primary) hypertension: Secondary | ICD-10-CM | POA: Insufficient documentation

## 2023-11-16 DIAGNOSIS — R001 Bradycardia, unspecified: Secondary | ICD-10-CM | POA: Insufficient documentation

## 2023-11-16 DIAGNOSIS — Z9884 Bariatric surgery status: Secondary | ICD-10-CM | POA: Diagnosis not present

## 2023-11-16 DIAGNOSIS — I498 Other specified cardiac arrhythmias: Secondary | ICD-10-CM | POA: Insufficient documentation

## 2023-11-16 DIAGNOSIS — K8064 Calculus of gallbladder and bile duct with chronic cholecystitis without obstruction: Secondary | ICD-10-CM | POA: Diagnosis not present

## 2023-11-16 NOTE — Telephone Encounter (Signed)
Patient is having surgery on Wednesday with Dr Claudine Mouton and she wants to come pick up a note stating that she is having surgery and will be out of work. Return to work will be determined at her post op visit.

## 2023-11-17 MED ORDER — CHLORHEXIDINE GLUCONATE 0.12 % MT SOLN
15.0000 mL | Freq: Once | OROMUCOSAL | Status: AC
  Administered 2023-11-18: 15 mL via OROMUCOSAL

## 2023-11-17 MED ORDER — CELECOXIB 200 MG PO CAPS
200.0000 mg | ORAL_CAPSULE | ORAL | Status: AC
  Administered 2023-11-18: 200 mg via ORAL

## 2023-11-17 MED ORDER — LACTATED RINGERS IV SOLN
INTRAVENOUS | Status: DC
Start: 2023-11-17 — End: 2023-11-18

## 2023-11-17 MED ORDER — CEFAZOLIN SODIUM-DEXTROSE 2-4 GM/100ML-% IV SOLN
2.0000 g | INTRAVENOUS | Status: AC
  Administered 2023-11-18: 2 g via INTRAVENOUS

## 2023-11-17 MED ORDER — CHLORHEXIDINE GLUCONATE CLOTH 2 % EX PADS
6.0000 | MEDICATED_PAD | Freq: Once | CUTANEOUS | Status: AC
  Administered 2023-11-17: 6 via TOPICAL

## 2023-11-17 MED ORDER — BUPIVACAINE LIPOSOME 1.3 % IJ SUSP: 20.0000 mL | Freq: Once | INTRAMUSCULAR | Status: DC

## 2023-11-17 MED ORDER — GABAPENTIN 300 MG PO CAPS
300.0000 mg | ORAL_CAPSULE | ORAL | Status: AC
  Administered 2023-11-18: 300 mg via ORAL

## 2023-11-17 MED ORDER — ORAL CARE MOUTH RINSE
15.0000 mL | Freq: Once | OROMUCOSAL | Status: AC
Start: 2023-11-17 — End: 2023-11-18

## 2023-11-17 MED ORDER — INDOCYANINE GREEN 25 MG IV SOLR
1.2500 mg | Freq: Once | INTRAVENOUS | Status: AC
Start: 2023-11-17 — End: 2023-11-18
  Administered 2023-11-18: 1.25 mg via INTRAVENOUS

## 2023-11-17 MED ORDER — CHLORHEXIDINE GLUCONATE CLOTH 2 % EX PADS
6.0000 | MEDICATED_PAD | Freq: Once | CUTANEOUS | Status: AC
  Administered 2023-11-18: 6 via TOPICAL

## 2023-11-17 MED ORDER — ACETAMINOPHEN 500 MG PO TABS
1000.0000 mg | ORAL_TABLET | ORAL | Status: AC
  Administered 2023-11-18: 1000 mg via ORAL

## 2023-11-18 ENCOUNTER — Encounter
Admission: RE | Disposition: A | Discharge status: Home / Self Care | Payer: Self-pay | Source: Home / Self Care | Attending: Surgery

## 2023-11-18 ENCOUNTER — Encounter: Payer: Self-pay | Admitting: Surgery

## 2023-11-18 ENCOUNTER — Ambulatory Visit: Payer: BC Managed Care – PPO | Admitting: Urgent Care

## 2023-11-18 ENCOUNTER — Ambulatory Visit: Payer: BC Managed Care – PPO | Admitting: Anesthesiology

## 2023-11-18 ENCOUNTER — Ambulatory Visit
Admission: RE | Admit: 2023-11-18 | Discharge: 2023-11-18 | Disposition: A | Discharge status: Home / Self Care | Payer: BC Managed Care – PPO | Attending: Surgery | Admitting: Surgery

## 2023-11-18 ENCOUNTER — Other Ambulatory Visit: Payer: Self-pay

## 2023-11-18 DIAGNOSIS — Z9884 Bariatric surgery status: Secondary | ICD-10-CM | POA: Insufficient documentation

## 2023-11-18 DIAGNOSIS — I10 Essential (primary) hypertension: Secondary | ICD-10-CM | POA: Insufficient documentation

## 2023-11-18 DIAGNOSIS — K8010 Calculus of gallbladder with chronic cholecystitis without obstruction: Secondary | ICD-10-CM | POA: Diagnosis not present

## 2023-11-18 DIAGNOSIS — G4733 Obstructive sleep apnea (adult) (pediatric): Secondary | ICD-10-CM | POA: Diagnosis not present

## 2023-11-18 DIAGNOSIS — K8064 Calculus of gallbladder and bile duct with chronic cholecystitis without obstruction: Secondary | ICD-10-CM | POA: Diagnosis not present

## 2023-11-18 SURGERY — CHOLECYSTECTOMY, ROBOT-ASSISTED, LAPAROSCOPIC
Anesthesia: General

## 2023-11-18 MED ORDER — SUGAMMADEX SODIUM 200 MG/2ML IV SOLN
INTRAVENOUS | Status: DC | PRN
  Administered 2023-11-18: 185 mg via INTRAVENOUS
  Administered 2023-11-18: 100 mg via INTRAVENOUS

## 2023-11-18 MED ORDER — BUPIVACAINE-EPINEPHRINE (PF) 0.25% -1:200000 IJ SOLN
INTRAMUSCULAR | Status: AC
  Filled 2023-11-18: qty 30

## 2023-11-18 MED ORDER — KETOROLAC TROMETHAMINE 30 MG/ML IJ SOLN
INTRAMUSCULAR | Status: DC | PRN
  Administered 2023-11-18: 30 mg via INTRAVENOUS

## 2023-11-18 MED ORDER — DEXAMETHASONE SODIUM PHOSPHATE 10 MG/ML IJ SOLN
INTRAMUSCULAR | Status: DC | PRN
  Administered 2023-11-18: 10 mg via INTRAVENOUS

## 2023-11-18 MED ORDER — IBUPROFEN 200 MG PO TABS: 400.0000 mg | ORAL_TABLET | Freq: Four times a day (QID) | ORAL | 0 refills | Status: AC | PRN

## 2023-11-18 MED ORDER — PHENYLEPHRINE 80 MCG/ML (10ML) SYRINGE FOR IV PUSH (FOR BLOOD PRESSURE SUPPORT)
PREFILLED_SYRINGE | INTRAVENOUS | Status: DC | PRN
  Administered 2023-11-18 (×2): 80 ug via INTRAVENOUS

## 2023-11-18 MED ORDER — DEXAMETHASONE SODIUM PHOSPHATE 10 MG/ML IJ SOLN
INTRAMUSCULAR | Status: AC
  Filled 2023-11-18: qty 1

## 2023-11-18 MED ORDER — CELECOXIB 200 MG PO CAPS
ORAL_CAPSULE | ORAL | Status: AC
  Filled 2023-11-18: qty 1

## 2023-11-18 MED ORDER — FENTANYL CITRATE (PF) 100 MCG/2ML IJ SOLN
INTRAMUSCULAR | Status: DC | PRN
  Administered 2023-11-18 (×2): 50 ug via INTRAVENOUS

## 2023-11-18 MED ORDER — CHLORHEXIDINE GLUCONATE 0.12 % MT SOLN
OROMUCOSAL | Status: AC
Start: 2023-11-18 — End: ?
  Filled 2023-11-18: qty 15

## 2023-11-18 MED ORDER — FENTANYL CITRATE (PF) 100 MCG/2ML IJ SOLN
INTRAMUSCULAR | Status: AC
  Filled 2023-11-18: qty 2

## 2023-11-18 MED ORDER — ROCURONIUM BROMIDE 10 MG/ML (PF) SYRINGE
PREFILLED_SYRINGE | INTRAVENOUS | Status: AC
  Filled 2023-11-18: qty 10

## 2023-11-18 MED ORDER — ONDANSETRON HCL 4 MG/2ML IJ SOLN
INTRAMUSCULAR | Status: AC
  Filled 2023-11-18: qty 2

## 2023-11-18 MED ORDER — INDOCYANINE GREEN 25 MG IV SOLR
INTRAVENOUS | Status: AC
  Filled 2023-11-18: qty 10

## 2023-11-18 MED ORDER — ROCURONIUM BROMIDE 100 MG/10ML IV SOLN
INTRAVENOUS | Status: DC | PRN
  Administered 2023-11-18: 50 mg via INTRAVENOUS

## 2023-11-18 MED ORDER — MIDAZOLAM HCL 2 MG/2ML IJ SOLN
INTRAMUSCULAR | Status: AC
  Filled 2023-11-18: qty 2

## 2023-11-18 MED ORDER — ACETAMINOPHEN 500 MG PO TABS
ORAL_TABLET | ORAL | Status: AC
  Filled 2023-11-18: qty 2

## 2023-11-18 MED ORDER — MIDAZOLAM HCL 2 MG/2ML IJ SOLN
INTRAMUSCULAR | Status: DC | PRN
  Administered 2023-11-18: 2 mg via INTRAVENOUS

## 2023-11-18 MED ORDER — FENTANYL CITRATE (PF) 100 MCG/2ML IJ SOLN
25.0000 ug | INTRAMUSCULAR | Status: DC | PRN
  Administered 2023-11-18: 25 ug via INTRAVENOUS
  Administered 2023-11-18: 50 ug via INTRAVENOUS
  Administered 2023-11-18: 25 ug via INTRAVENOUS

## 2023-11-18 MED ORDER — OXYCODONE HCL 5 MG PO TABS
5.0000 mg | ORAL_TABLET | Freq: Once | ORAL | Status: AC | PRN
  Administered 2023-11-18: 5 mg via ORAL

## 2023-11-18 MED ORDER — HYDROCODONE-ACETAMINOPHEN 5-325 MG PO TABS: 1.0000 | ORAL_TABLET | Freq: Four times a day (QID) | ORAL | 0 refills | Status: DC | PRN

## 2023-11-18 MED ORDER — PROPOFOL 10 MG/ML IV BOLUS
INTRAVENOUS | Status: DC | PRN
  Administered 2023-11-18: 200 mg via INTRAVENOUS

## 2023-11-18 MED ORDER — BUPIVACAINE-EPINEPHRINE (PF) 0.25% -1:200000 IJ SOLN
INTRAMUSCULAR | Status: DC | PRN
  Administered 2023-11-18: 30 mL

## 2023-11-18 MED ORDER — OXYCODONE HCL 5 MG PO TABS
ORAL_TABLET | ORAL | Status: AC
  Filled 2023-11-18: qty 1

## 2023-11-18 MED ORDER — EPHEDRINE SULFATE-NACL 50-0.9 MG/10ML-% IV SOSY
PREFILLED_SYRINGE | INTRAVENOUS | Status: DC | PRN
  Administered 2023-11-18: 10 mg via INTRAVENOUS
  Administered 2023-11-18: 5 mg via INTRAVENOUS

## 2023-11-18 MED ORDER — KETOROLAC TROMETHAMINE 30 MG/ML IJ SOLN
INTRAMUSCULAR | Status: AC
  Filled 2023-11-18: qty 1

## 2023-11-18 MED ORDER — LIDOCAINE HCL (CARDIAC) PF 100 MG/5ML IV SOSY
PREFILLED_SYRINGE | INTRAVENOUS | Status: DC | PRN
  Administered 2023-11-18: 100 mg via INTRAVENOUS

## 2023-11-18 MED ORDER — EPHEDRINE 5 MG/ML INJ
INTRAVENOUS | Status: AC
  Filled 2023-11-18: qty 5

## 2023-11-18 MED ORDER — GABAPENTIN 300 MG PO CAPS
ORAL_CAPSULE | ORAL | Status: AC
  Filled 2023-11-18: qty 1

## 2023-11-18 MED ORDER — PHENYLEPHRINE 80 MCG/ML (10ML) SYRINGE FOR IV PUSH (FOR BLOOD PRESSURE SUPPORT)
PREFILLED_SYRINGE | INTRAVENOUS | Status: AC
Start: 2023-11-18 — End: ?
  Filled 2023-11-18: qty 10

## 2023-11-18 MED ORDER — CEFAZOLIN SODIUM-DEXTROSE 2-4 GM/100ML-% IV SOLN
INTRAVENOUS | Status: AC
  Filled 2023-11-18: qty 100

## 2023-11-18 MED ORDER — PROPOFOL 10 MG/ML IV BOLUS
INTRAVENOUS | Status: AC
  Filled 2023-11-18: qty 20

## 2023-11-18 MED ORDER — ONDANSETRON HCL 4 MG/2ML IJ SOLN
INTRAMUSCULAR | Status: DC | PRN
  Administered 2023-11-18: 4 mg via INTRAVENOUS

## 2023-11-18 MED ORDER — OXYCODONE HCL 5 MG/5ML PO SOLN: 5.0000 mg | Freq: Once | ORAL | Status: AC | PRN

## 2023-11-18 SURGICAL SUPPLY — 41 items
BAG PRESSURE INF REUSE 3000 (BAG) IMPLANT
CLIP LIGATING HEM O LOK PURPLE (MISCELLANEOUS) ×2 IMPLANT
COVER TIP SHEARS 8 DVNC (MISCELLANEOUS) ×2 IMPLANT
DERMABOND ADVANCED .7 DNX12 (GAUZE/BANDAGES/DRESSINGS) ×2 IMPLANT
DRAPE ARM DVNC X/XI (DISPOSABLE) ×8 IMPLANT
DRAPE COLUMN DVNC XI (DISPOSABLE) ×2 IMPLANT
FORCEPS BPLR R/ABLATION 8 DVNC (INSTRUMENTS) ×2 IMPLANT
FORCEPS PROGRASP DVNC XI (FORCEP) ×2 IMPLANT
GLOVE ORTHO TXT STRL SZ7.5 (GLOVE) ×4 IMPLANT
GOWN STRL REUS W/ TWL LRG LVL3 (GOWN DISPOSABLE) ×4 IMPLANT
GOWN STRL REUS W/ TWL XL LVL3 (GOWN DISPOSABLE) ×4 IMPLANT
GRASPER SUT TROCAR 14GX15 (MISCELLANEOUS) ×2 IMPLANT
IRRIGATION STRYKERFLOW (MISCELLANEOUS) IMPLANT
IRRIGATOR STRYKERFLOW (MISCELLANEOUS)
IRRIGATOR SUCT 8 DISP DVNC XI (IRRIGATION / IRRIGATOR) IMPLANT
IV NS IRRIG 3000ML ARTHROMATIC (IV SOLUTION) IMPLANT
KIT PINK PAD W/HEAD ARE REST (MISCELLANEOUS) ×1
KIT PINK PAD W/HEAD ARM REST (MISCELLANEOUS) ×2 IMPLANT
KIT TURNOVER KIT A (KITS) ×2 IMPLANT
LABEL OR SOLS (LABEL) ×2 IMPLANT
MANIFOLD NEPTUNE II (INSTRUMENTS) ×2 IMPLANT
NDL HYPO 22X1.5 SAFETY MO (MISCELLANEOUS) ×2 IMPLANT
NDL INSUFFLATION 14GA 120MM (NEEDLE) ×2 IMPLANT
NEEDLE HYPO 22X1.5 SAFETY MO (MISCELLANEOUS) ×1
NEEDLE INSUFFLATION 14GA 120MM (NEEDLE) ×1
NS IRRIG 500ML POUR BTL (IV SOLUTION) ×2 IMPLANT
PACK LAP CHOLECYSTECTOMY (MISCELLANEOUS) ×2 IMPLANT
SCISSORS MNPLR CVD DVNC XI (INSTRUMENTS) ×2 IMPLANT
SEAL UNIV 5-12 XI (MISCELLANEOUS) ×8 IMPLANT
SET TUBE SMOKE EVAC HIGH FLOW (TUBING) ×2 IMPLANT
SOL ELECTROSURG ANTI STICK (MISCELLANEOUS) ×1
SOLUTION ELECTROSURG ANTI STCK (MISCELLANEOUS) ×2 IMPLANT
SPIKE FLUID TRANSFER (MISCELLANEOUS) ×2 IMPLANT
SUT MNCRL 4-0 27XMFL (SUTURE) ×1
SUT VICRYL 0 UR6 27IN ABS (SUTURE) ×2 IMPLANT
SUTURE MNCRL 4-0 27XMF (SUTURE) ×2 IMPLANT
SYS BAG RETRIEVAL 10MM (BASKET) ×1
SYSTEM BAG RETRIEVAL 10MM (BASKET) ×2 IMPLANT
TRAP FLUID SMOKE EVACUATOR (MISCELLANEOUS) ×2 IMPLANT
TROCAR Z-THREAD FIOS 11X100 BL (TROCAR) ×2 IMPLANT
WATER STERILE IRR 500ML POUR (IV SOLUTION) ×2 IMPLANT

## 2023-11-18 NOTE — Anesthesia Postprocedure Evaluation (Signed)
Anesthesia Post Note  Patient: [name]  Procedure(s) Performed: XI ROBOTIC ASSISTED LAPAROSCOPIC CHOLECYSTECTOMY INDOCYANINE GREEN FLUORESCENCE IMAGING (ICG)  Patient location during evaluation: PACU Anesthesia Type: General Level of consciousness: awake and alert Pain management: pain level controlled Vital Signs Assessment: post-procedure vital signs reviewed and stable Respiratory status: spontaneous breathing, nonlabored ventilation, respiratory function stable and patient connected to nasal cannula oxygen Cardiovascular status: blood pressure returned to baseline and stable Postop Assessment: no apparent nausea or vomiting Anesthetic complications: no   No notable events documented.   Last Vitals:  Vitals:   11/18/23 1011 11/18/23 1012  BP:  132/61  Pulse: 64   Resp: 15   Temp: (!) 36.1 C   SpO2: 97%     Last Pain:  Vitals:   11/18/23 1011  TempSrc: Temporal  PainSc: 3                  Cleda Mccreedy Curren Mohrmann

## 2023-11-18 NOTE — Interval H&P Note (Signed)
History and Physical Interval Note:  11/18/2023 7:22 AM  [name]  has presented today for surgery, with the diagnosis of chronic calculous cholecystitis.  The various methods of treatment have been discussed with the patient and family. After consideration of risks, benefits and other options for treatment, the patient has consented to  Procedure(s): XI ROBOTIC ASSISTED LAPAROSCOPIC CHOLECYSTECTOMY (N/A) INDOCYANINE GREEN FLUORESCENCE IMAGING (ICG) (N/A) as a surgical intervention.  The patient's history has been reviewed, patient examined, no change in status, stable for surgery.  I have reviewed the patient's chart and labs.  Questions were answered to the patient's satisfaction.     Campbell Lerner

## 2023-11-18 NOTE — Anesthesia Preprocedure Evaluation (Signed)
Anesthesia Evaluation  Patient identified by MRN, date of birth, ID band Patient awake    Reviewed: Allergy & Precautions, NPO status , Patient's Chart, lab work & pertinent test results  History of Anesthesia Complications Negative for: history of anesthetic complications  Airway Mallampati: III  TM Distance: >3 FB Neck ROM: full    Dental  (+) Chipped   Pulmonary sleep apnea    Pulmonary exam normal        Cardiovascular Exercise Tolerance: Good hypertension, (-) angina (-) Past MI and (-) DOE Normal cardiovascular exam     Neuro/Psych  Headaches PSYCHIATRIC DISORDERS         GI/Hepatic Neg liver ROS,GERD  Controlled,,  Endo/Other  negative endocrine ROS    Renal/GU      Musculoskeletal   Abdominal   Peds  Hematology negative hematology ROS (+)   Anesthesia Other Findings Past Medical History: 01/30/2023: Acute sinusitis No date: Allergy No date: Anxiety No date: Chronic cholecystitis with calculus No date: GERD (gastroesophageal reflux disease) No date: Hepatic steatosis No date: History of chickenpox No date: History of migraine No date: Hyperlipidemia No date: Hypertension No date: Migraines No date: Morbid obesity (HCC) No date: S/P gastric bypass No date: Sleep apnea     Comment:  no CPAP needed now since gastric bypass No date: Vitamin D deficiency  Past Surgical History: 05/2005: CESAREAN SECTION 02/27/2014: GASTRIC ROUX-EN-Y; N/A     Comment:  Procedure: LAPAROSCOPIC ROUX-EN-Y GASTRIC BYPASS WITH               UPPER ENDOSCOPY ;  Surgeon: Atilano Ina, MD;  Location:              [facility];  Service: General;  Laterality: N/A; 1988: THERAPEUTIC ABORTION 2011: TUBAL LIGATION  BMI    Body Mass Index: 37.31 kg/m      Reproductive/Obstetrics negative OB ROS                             Anesthesia Physical Anesthesia Plan  ASA: 3  Anesthesia Plan: General ETT    Post-op Pain Management:    Induction: Intravenous  PONV Risk Score and Plan: Ondansetron, Dexamethasone, Midazolam and Treatment may vary due to age or medical condition  Airway Management Planned: Oral ETT  Additional Equipment:   Intra-op Plan:   Post-operative Plan: Extubation in OR  Informed Consent: I have reviewed the patients History and Physical, chart, labs and discussed the procedure including the risks, benefits and alternatives for the proposed anesthesia with the patient or authorized representative who has indicated his/her understanding and acceptance.     Dental Advisory Given  Plan Discussed with: Anesthesiologist, CRNA and Surgeon  Anesthesia Plan Comments: (Patient consented for risks of anesthesia including but not limited to:  - adverse reactions to medications - damage to eyes, teeth, lips or other oral mucosa - nerve damage due to positioning  - sore throat or hoarseness - Damage to heart, brain, nerves, lungs, other parts of body or loss of life  Patient voiced understanding and assent.)       Anesthesia Quick Evaluation

## 2023-11-18 NOTE — Transfer of Care (Signed)
Immediate Anesthesia Transfer of Care Note  Patient: [name]  Procedure(s) Performed: XI ROBOTIC ASSISTED LAPAROSCOPIC CHOLECYSTECTOMY INDOCYANINE GREEN FLUORESCENCE IMAGING (ICG)  Patient Location: PACU  Anesthesia Type:General  Level of Consciousness: drowsy  Airway & Oxygen Therapy: Patient Spontanous Breathing and Patient connected to face mask oxygen  Post-op Assessment: Report given to RN and Post -op Vital signs reviewed and stable  Post vital signs: Reviewed and stable  Last Vitals:  Vitals Value Taken Time  BP 157/64   Temp    Pulse 81 11/18/23 0903  Resp 20 11/18/23 0903  SpO2 100 % 11/18/23 0903  Vitals shown include unfiled device data.  Last Pain:  Vitals:   11/18/23 0609  TempSrc: Temporal  PainSc: 6          Complications: No notable events documented.

## 2023-11-18 NOTE — Op Note (Signed)
Robotic cholecystectomy with Indocyamine Green Ductal Imaging.   Pre-operative Diagnosis: Chronic calculus cholecystitis  Post-operative Diagnosis:  Same.  Procedure: Robotic assisted laparoscopic cholecystectomy with Indocyamine Green Ductal Imaging.   Surgeon: Campbell Lerner, M.D., FACS  Anesthesia: General. with endotracheal tube  Findings: large stones, infundibulum medial to CD.    Estimated Blood Loss: 15 mL         Drains: None         Specimens: Gallbladder           Complications: none  Procedure Details  The patient was seen again in the Holding Room.  1.25 mg dose of ICG was administered intravenously.   The benefits, complications, treatment options, risks and expected outcomes were again reviewed with the patient. The likelihood of improving the patient's symptoms with return to their baseline status is good.  The patient and/or family concurred with the proposed plan, giving informed consent, again alternatives reviewed.  The patient was taken to Operating Room, identified, and the procedure verified as robotic assisted laparoscopic cholecystectomy.  Prior to the induction of general anesthesia, antibiotic prophylaxis was administered. VTE prophylaxis was in place. General endotracheal anesthesia was then administered and tolerated well. The patient was positioned in the supine position.  After the induction, the abdomen was prepped with Chloraprep and draped in the sterile fashion.  A Time Out was held and the above information confirmed.  After local infiltration of quarter percent Marcaine with epinephrine, stab incision was made left upper quadrant.  Just below the costal margin at Palmer's point, approximately midclavicular line the Veres needle is passed with sensation of the layers to penetrate the abdominal wall and into the peritoneum.  Saline drop test is confirmed peritoneal placement.  Insufflation is initiated with carbon dioxide to pressures of 15  mmHg.  Local infiltration with 0.25% Marcaine with epinephrine is utilized for all skin incisions.  Made a 12 mm incision on the right periumbilical site, I advanced an optical 11mm port under direct visualization into the peritoneal cavity.  Once the peritoneum was penetrated, insufflation was initiated.  The trocar was then advanced into the abdominal cavity under direct visualization. Pneumoperitoneum was then continued utilizing CO2 at 15 mmHg or less and tolerated well without any adverse changes in the patient's vital signs.  Two 8.5-mm ports were placed in the left lower quadrant and laterally, and one to the right lower quadrant, all under direct vision. Local infiltration with a mixture of Exparel and 0.25% Marcaine with epinephrine is utilized for all port sites with deep infiltration under visualization.   The patient was positioned  in reverse Trendelenburg, tilted the patient's left side down.  Da Vinci XI robot was then positioned on to the patient's left side, and docked.  The gallbladder was identified, the fundus grasped via the arm 4 Prograsp and retracted cephalad. Adhesions were lysed with scissors and cautery.  The infundibulum was identified , mobilized and grasped and retracted laterally, exposing the overlying triangle of Calot. This was then opened and dissected using cautery & scissors.  We did achieve the critical view of safety with 2 and only 2 structures entering the gallbladder and we were able to see the liver plate posterior to this from both sides;with the extended critical view of the cystic duct and cystic artery obtained, aided by the ICG via FireFly which improved localization of the major ductal anatomy.    The cystic duct was clearly identified and dissected to isolation.   Artery well isolated and  clipped, and the cystic duct was triple clipped and divided with scissors, as close to the gallbladder neck as feasible, thus leaving two on the remaining stump.  The  specimen side of the artery is sealed with bipolar and divided with monopolar scissors.   The gallbladder was taken from the gallbladder fossa in a retrograde fashion with the electrocautery. The gallbladder was removed and placed in an Endocatch bag.  The liver bed is inspected. Hemostasis was confirmed.  The robot was undocked and moved away from the operative field. No irrigation was utilized.   The gallbladder and Endocatch sac were then removed through the paraumbilical port site.   Inspection of the right upper quadrant was performed. No bleeding, bile duct injury or leak, or bowel injury was noted. The infra-umbilical port site fascia was closed with interrumpted 0 Vicryl sutures using PMI/cone under direct visualization. Pneumoperitoneum was released and ports removed.  4-0 subcuticular Monocryl was used to close the skin. Dermabond was  applied.  The patient was then extubated and brought to the recovery room in stable condition. Sponge, lap, and needle counts were correct at closure and at the conclusion of the case.               Campbell Lerner, M.D., [facility] 11/18/2023 9:12 AM

## 2023-11-18 NOTE — Anesthesia Procedure Notes (Signed)
Procedure Name: Intubation Date/Time: 11/18/2023 7:41 AM  Performed by: Emeterio Reeve, CRNAPre-anesthesia Checklist: Patient identified, Emergency Drugs available, Suction available and Patient being monitored Patient Re-evaluated:Patient Re-evaluated prior to induction Oxygen Delivery Method: Circle system utilized Preoxygenation: Pre-oxygenation with 100% oxygen Induction Type: IV induction Ventilation: Mask ventilation without difficulty Laryngoscope Size: Mac and 4 Grade View: Grade II Tube type: Oral Tube size: 7.0 mm Number of attempts: 1 Airway Equipment and Method: Stylet and Oral airway Placement Confirmation: ETT inserted through vocal cords under direct vision, positive ETCO2 and breath sounds checked- equal and bilateral Secured at: 21 cm Tube secured with: Tape Dental Injury: Teeth and Oropharynx as per pre-operative assessment  Comments: Cords clear; limited mouth opening. CA

## 2023-11-19 LAB — SURGICAL PATHOLOGY

## 2023-12-02 ENCOUNTER — Ambulatory Visit (INDEPENDENT_AMBULATORY_CARE_PROVIDER_SITE_OTHER): Payer: BC Managed Care – PPO | Admitting: Physician Assistant

## 2023-12-02 ENCOUNTER — Encounter: Payer: Self-pay | Admitting: Physician Assistant

## 2023-12-02 VITALS — BP 138/84 | HR 66 | Temp 98.2°F | Ht 62.0 in | Wt 201.8 lb

## 2023-12-02 DIAGNOSIS — K8010 Calculus of gallbladder with chronic cholecystitis without obstruction: Secondary | ICD-10-CM

## 2023-12-02 DIAGNOSIS — Z09 Encounter for follow-up examination after completed treatment for conditions other than malignant neoplasm: Secondary | ICD-10-CM

## 2023-12-02 NOTE — Progress Notes (Signed)
[facility] POST-OP OFFICE VISIT  12/02/2023  HPI: [name] is a 52 y.o. female 14 days s/p robotic assisted laparoscopic cholecystectomy for [facility] with Dr Claudine Mouton   She is doing well She did have some constipation at first; now resolved - never needed narcotics Some umbilical discomfort; improved significant - controlled with OTC Advil No fever, chills, nausea, emesis She is watching her diet; no diarrhea Incisions are well healed Ambulating without issue   Vital signs: BP 138/84   Pulse 66   Temp 98.2 F (36.8 C) (Oral)   Ht 5\' 2"  (1.575 m)   Wt 201 lb 12.8 oz (91.5 kg)   LMP 01/06/2020 Comment: had a tubal  SpO2 99%   BMI 36.91 kg/m    Physical Exam: Constitutional: Well appearing female, NAD Abdomen: Soft, non-tender, non-distended, no rebound/guarding Skin: Laparoscopic incisions are healing well, no erythema or drainage   Assessment/Plan: This is a 52 y.o. female 14 days s/p robotic assisted laparoscopic cholecystectomy for [facility] with Dr Claudine Mouton    - Pain control prn  - Reviewed wound care recommendation  - Reviewed lifting restrictions; 4 weeks total  - Reviewed surgical pathology; [facility]  - She can follow up on as needed basis; She understands to call with questions/concerns  -- Lynden Oxford, PA-C [facility] 12/02/2023, 3:03 PM M-F: 7am - 4pm

## 2023-12-02 NOTE — Patient Instructions (Signed)

## 2023-12-10 ENCOUNTER — Telehealth: Payer: Self-pay | Admitting: *Deleted

## 2023-12-10 NOTE — Telephone Encounter (Signed)
Faxed FMLA to [employer] at [phone]

## 2023-12-28 ENCOUNTER — Other Ambulatory Visit: Payer: Self-pay | Admitting: Primary Care

## 2023-12-28 DIAGNOSIS — G43901 Migraine, unspecified, not intractable, with status migrainosus: Secondary | ICD-10-CM

## 2024-01-06 ENCOUNTER — Other Ambulatory Visit: Payer: Self-pay | Admitting: Primary Care

## 2024-01-06 DIAGNOSIS — F411 Generalized anxiety disorder: Secondary | ICD-10-CM

## 2024-01-15 ENCOUNTER — Other Ambulatory Visit: Payer: Self-pay | Admitting: Primary Care

## 2024-01-15 DIAGNOSIS — I10 Essential (primary) hypertension: Secondary | ICD-10-CM

## 2024-01-15 MED ORDER — HYDROCHLOROTHIAZIDE 25 MG PO TABS: ORAL_TABLET | ORAL | 2 refills | Status: DC

## 2024-01-15 NOTE — Telephone Encounter (Signed)
Copied from CRM [id number]. Topic: Clinical - Medication Refill >> Jan 15, 2024  9:39 AM Fredrich Romans wrote: Most Recent Primary Care Visit:  Provider: Doreene Nest  Department: [facility]  Visit Type: PHYSICAL  Date: 11/05/2023  Medication: hydrochlorothiazide (HYDRODIURIL) 25 MG tablet  Has the patient contacted their pharmacy? No (Agent: If no, request that the patient contact the pharmacy for the refill. If patient does not wish to contact the pharmacy document the reason why and proceed with request.) (Agent: If yes, when and what did the pharmacy advise?)  Is this the correct pharmacy for this prescription? Yes If no, delete pharmacy and type the correct one.  This is the patient's preferred pharmacy:   [facility] - [place], [place] - [address] [place] [address] Phone: [phone] Fax: [phone]     Has the prescription been filled recently? Yes  Is the patient out of the medication? No  Has the patient been seen for an appointment in the last year OR does the patient have an upcoming appointment? Yes  Can we respond through MyChart? Yes  Agent: Please be advised that Rx refills may take up to 3 business days. We ask that you follow-up with your pharmacy.

## 2024-01-15 NOTE — Addendum Note (Signed)
Addended by: Lonia Blood on: 01/15/2024 02:26 PM   Modules accepted: Orders

## 2024-01-15 NOTE — Addendum Note (Signed)
Addended by: Doreene Nest on: 01/15/2024 04:31 PM   Modules accepted: Orders

## 2024-02-19 ENCOUNTER — Ambulatory Visit: Payer: Self-pay | Admitting: Primary Care

## 2024-02-19 ENCOUNTER — Encounter: Payer: Self-pay | Admitting: Family Medicine

## 2024-02-19 ENCOUNTER — Telehealth: Payer: BC Managed Care – PPO | Admitting: Family Medicine

## 2024-02-19 VITALS — Temp 97.1°F | Ht 62.5 in | Wt 206.0 lb

## 2024-02-19 DIAGNOSIS — J0100 Acute maxillary sinusitis, unspecified: Secondary | ICD-10-CM | POA: Diagnosis not present

## 2024-02-19 MED ORDER — GUAIFENESIN-CODEINE 100-10 MG/5ML PO SOLN: 5.0000 mL | Freq: Four times a day (QID) | ORAL | 0 refills | Status: DC | PRN

## 2024-02-19 MED ORDER — AMOXICILLIN 500 MG PO CAPS: 1000.0000 mg | ORAL_CAPSULE | Freq: Two times a day (BID) | ORAL | 0 refills | Status: DC

## 2024-02-19 NOTE — Telephone Encounter (Signed)
 Copied from CRM [id number]. Topic: Clinical - Red Word Triage >> Feb 19, 2024  8:34 AM Orinda Kenner C wrote: Red Word that prompted transfer to Nurse Triage: Patient [phone] states symptoms of runny nose- clear mucous, sneezing, head congestion, coughing phlegm dark yellow and thick, sweating, shortness of breath, behind the right ear is tender and pain. Patient denies a fever, or dizziness. Patient has tried many OTC products has not been helpful, would like to be seen.  Chief Complaint: Cough Symptoms: Congestion Frequency: 1-2 weeks Pertinent Negatives: Patient denies fever Disposition: [] ED /[] Urgent Care (no appt availability in office) / [x] Appointment(In office/virtual)/ []  [facility]/ [] Home Care/ [] Refused Recommended Disposition /[] [facility]/ []  Follow-up with PCP Additional Notes: Patient called in to report cold symptoms that have been going on for 1-2 weeks. Patient reported a productive cough. Patient stated the cough produces dark yellow phlegm. Patient stated the cough wakes her up frequently throughout the night and causes coughing spells throughout the day. Patient is also experiencing head and nasal congestion. Patient stated she experiences mild SOB when walking up the stairs. Patient denied SOB at rest or while laying down. Patient able to speak in clear and complete sentences while on the phone with this RN. This RN advised patient to see a provider within 4 hours. Patient requested virtual visit. No availability with PCP. Scheduled patient with an alternate provider in the office for later this morning. This RN advised patient to call back if symptoms worsen. Patient complied.   Reason for Disposition  [1] MILD difficulty breathing (e.g., minimal/no SOB at rest, SOB with walking, pulse <100) AND [2] still present when not coughing  Answer Assessment - Initial Assessment Questions 1. ONSET: "When did the cough begin?"      Over a week ago 2. SEVERITY:  "How bad is the cough today?"      States the cough wakes her up throughout the day, sporadic coughing spells throughout the day 3. SPUTUM: "Describe the color of your sputum" (none, dry cough; clear, white, yellow, green)     Dark yellow 4. HEMOPTYSIS: "Are you coughing up any blood?" If so ask: "How much?" (flecks, streaks, tablespoons, etc.)     Denies 5. DIFFICULTY BREATHING: "Are you having difficulty breathing?" If Yes, ask: "How bad is it?" (e.g., mild, moderate, severe)    - MILD: No SOB at rest, mild SOB with walking, speaks normally in sentences, can lie down, no retractions, pulse < 100.    - MODERATE: SOB at rest, SOB with minimal exertion and prefers to sit, cannot lie down flat, speaks in phrases, mild retractions, audible wheezing, pulse 100-120.    - SEVERE: Very SOB at rest, speaks in single words, struggling to breathe, sitting hunched forward, retractions, pulse > 120      Denies difficulty breathing at rest and when laying down flat, states she feels slight SOB when walking up the stairs 6. FEVER: "Do you have a fever?" If Yes, ask: "What is your temperature, how was it measured, and when did it start?"     Denies 7. CARDIAC HISTORY: "Do you have any history of heart disease?" (e.g., heart attack, congestive heart failure)      Denies 8. LUNG HISTORY: "Do you have any history of lung disease?"  (e.g., pulmonary embolus, asthma, emphysema)     Denies 10. OTHER SYMPTOMS: "Do you have any other symptoms?" (e.g., runny nose, wheezing, chest pain)       Nasal congestion, head congestion,  recent sore throat, denies wheezing, denies pain, denies dizziness  Protocols used: Cough - Acute Productive-A-AH

## 2024-02-19 NOTE — Assessment & Plan Note (Signed)
 Acute, symptoms initially likely viral, now ongoing greater than 10 days with change in color of mucus. Some concern for bacterial superinfection. Recommend nasal saline spray, Mucinex as mucolytic.  Will provide cough suppressant to help her rest at night.  Will treat with amoxicillin 500 mg 2 tablets twice daily x 10 days.  Return and ER precautions provided.

## 2024-02-19 NOTE — Progress Notes (Signed)
 VIRTUAL VISIT A virtual visit is felt to be most appropriate for this patient at this time.   I connected with the patient on 02/19/24 at 11:00 AM EST by virtual telehealth platform and verified that I am speaking with the correct person using two identifiers.   I discussed the limitations, risks, security and privacy concerns of performing an evaluation and management service by  virtual telehealth platform and the availability of in person appointments. I also discussed with the patient that there may be a patient responsible charge related to this service. The patient expressed understanding and agreed to proceed.  Patient location: Home Provider Location: [place] Participants: Kerby Nora and Rickey Barbara   Chief Complaint  Patient presents with   Cough    Keeping her up at night Thick yellow phlegm Started on Tuesday   Shortness of Breath    On Exertion     Nasal Congestion   Fatigue         History of Present Illness:  53 y.o. female patient of Doreene Nest, NP presents with cough   Date of onset:  Had a cold in last 2 weeks, improved,  now returned worsened in the last 5 days Initial symptoms included significant ST, nasal congestion and fatigue Symptoms progressed to productive cough with thick yellow phlegm... keeping her up at night  Head pressure and concretion., pain behind right ear, headache. Shortness of breath with exertion.. minimal change from baseline.  No fever  No body ache   Sick contacts:  husband with cold, visits family at [facility] COVID testing:   none     She has tried to treat with ibuprofen, cough and cold medicaiton... dayquila nd nyquil     No history of chronic lung disease such as asthma or COPD. Has history of obesity, BMI 37, hypertension Non-smoker.    COVID 19 screen No recent travel or known exposure to COVID19 The patient denies respiratory symptoms of COVID 19 at this time.  The importance of social  distancing was discussed today.   Review of Systems  Constitutional:  Positive for malaise/fatigue. Negative for chills and fever.  HENT:  Positive for congestion, ear pain, sinus pain and sore throat.   Respiratory:  Positive for cough, sputum production and shortness of breath. Negative for wheezing.       Past Medical History:  Diagnosis Date   Acute sinusitis 01/30/2023   Allergy    Anxiety    Chronic cholecystitis with calculus    GERD (gastroesophageal reflux disease)    Hepatic steatosis    History of chickenpox    History of migraine    Hyperlipidemia    Hypertension    Migraines    Morbid obesity (HCC)    S/P gastric bypass    Sleep apnea    no CPAP needed now since gastric bypass   Vitamin D deficiency     reports that she has never smoked. She has never been exposed to tobacco smoke. She has never used smokeless tobacco. She reports current alcohol use. She reports that she does not use drugs.   Current Outpatient Medications:    amoxicillin (AMOXIL) 500 MG capsule, Take 2 capsules (1,000 mg total) by mouth 2 (two) times daily., Disp: 40 capsule, Rfl: 0   calcium carbonate (TUMS - DOSED IN MG ELEMENTAL CALCIUM) 500 MG chewable tablet, Chew 1 tablet by mouth as needed for indigestion or heartburn., Disp: , Rfl:    Calcium Carbonate-Vitamin D (CALTRATE  600+D PO), Take 1 tablet by mouth 3 (three) times daily., Disp: , Rfl:    cetirizine (ZYRTEC) 10 MG tablet, Take 10 mg by mouth at bedtime., Disp: , Rfl:    ferrous sulfate 325 (65 FE) MG tablet, Take 325 mg by mouth daily with breakfast., Disp: , Rfl:    guaiFENesin-codeine 100-10 MG/5ML syrup, Take 5 mLs by mouth every 6 (six) hours as needed for cough., Disp: 100 mL, Rfl: 0   hydrochlorothiazide (HYDRODIURIL) 25 MG tablet, TAKE 1 TABLET BY MOUTH EVERY DAY for blood pressure., Disp: 90 tablet, Rfl: 2   hydrOXYzine (ATARAX) 10 MG tablet, Take 1 tablet (10 mg total) by mouth 2 (two) times daily as needed for anxiety.,  Disp: 30 tablet, Rfl: 0   ibuprofen (ADVIL) 200 MG tablet, Take 2 tablets (400 mg total) by mouth every 6 (six) hours as needed for moderate pain (pain score 4-6) (headaches/migraines, or surgical pain)., Disp: 30 tablet, Rfl: 0   polyethylene glycol (MIRALAX / GLYCOLAX) 17 g packet, Take 17 g by mouth daily as needed for moderate constipation., Disp: , Rfl:    topiramate (TOPAMAX) 50 MG tablet, TAKE 1 TABLET BY MOUTH AT  BEDTIME FOR HEADACHE PREVENTION, Disp: 90 tablet, Rfl: 2   Observations/Objective: Temperature (!) 97.1 F (36.2 C), temperature source Oral, height 5' 2.5" (1.588 m), weight 206 lb (93.4 kg), last menstrual period 01/06/2020.  Physical Exam Constitutional:      General: The patient is not in acute distress. Pulmonary:     Effort: Pulmonary effort is normal. No respiratory distress.  Neurological:     Mental Status: The patient is alert and oriented to person, place, and time.  Psychiatric:        Mood and Affect: Mood normal.        Behavior: Behavior normal.    Assessment and Plan Acute non-recurrent maxillary sinusitis Assessment & Plan: Acute, symptoms initially likely viral, now ongoing greater than 10 days with change in color of mucus. Some concern for bacterial superinfection. Recommend nasal saline spray, Mucinex as mucolytic.  Will provide cough suppressant to help her rest at night.  Will treat with amoxicillin 500 mg 2 tablets twice daily x 10 days.  Return and ER precautions provided.   Other orders -     Amoxicillin; Take 2 capsules (1,000 mg total) by mouth 2 (two) times daily.  Dispense: 40 capsule; Refill: 0 -     guaiFENesin-Codeine; Take 5 mLs by mouth every 6 (six) hours as needed for cough.  Dispense: 100 mL; Refill: 0      I discussed the assessment and treatment plan with the patient. The patient was provided an opportunity to ask questions and all were answered. The patient agreed with the plan and demonstrated an understanding of the  instructions.   The patient was advised to call back or seek an in-person evaluation if the symptoms worsen or if the condition fails to improve as anticipated.     Kerby Nora, MD

## 2024-06-07 ENCOUNTER — Encounter: Payer: Self-pay | Admitting: Primary Care

## 2024-06-07 ENCOUNTER — Ambulatory Visit: Payer: Self-pay | Admitting: Primary Care

## 2024-06-07 ENCOUNTER — Ambulatory Visit (INDEPENDENT_AMBULATORY_CARE_PROVIDER_SITE_OTHER): Admitting: Primary Care

## 2024-06-07 DIAGNOSIS — E66813 Obesity, class 3: Secondary | ICD-10-CM | POA: Diagnosis not present

## 2024-06-07 DIAGNOSIS — Z6841 Body Mass Index (BMI) 40.0 and over, adult: Secondary | ICD-10-CM | POA: Diagnosis not present

## 2024-06-07 LAB — POCT GLYCOSYLATED HEMOGLOBIN (HGB A1C): Hemoglobin A1C: 5.5 % (ref 4.0–5.6)

## 2024-06-07 MED ORDER — SEMAGLUTIDE-WEIGHT MANAGEMENT 0.25 MG/0.5ML ~~LOC~~ SOAJ: 0.2500 mg | SUBCUTANEOUS | 0 refills | Status: DC

## 2024-06-07 NOTE — Assessment & Plan Note (Addendum)
 Uncontrolled with weight gain over the years.  She is aware of her terrible diet, is working on improving over the last 2 weeks. Commended her on dietary changes and increased physical activity.  We discussed options for medical management of her obesity.  Will start with Wegovy. Start semaglutide [facility]) for weight loss. Start by injecting 0.25 mg into the skin once weekly for 4 weeks, then increase to 0.5 mg once weekly thereafter.   Discussed instructions for use, common side effects.  Follow up in 3 months.

## 2024-06-07 NOTE — Patient Instructions (Signed)
 Start semaglutide [facility]) for weight loss. Start by injecting 0.25 mg into the skin once weekly for 4 weeks, then increase to 0.5 mg once weekly thereafter.  Please notify me once you have used your last 0.25 mg pen so that I can send the 0.5 mg dose to your pharmacy.  Please schedule a follow up visit for 3 months.  It was a pleasure to see you today!

## 2024-06-07 NOTE — Progress Notes (Signed)
 Subjective:    Patient ID: [name], female    DOB: 1971/12/20, 53 y.o.   MRN: [id number]  HPI  [name] is a very pleasant 53 y.o. female with a history of hypertension, gastric bypass, hyperlipidemia, constipation, back pain, obesity who presents today to discuss obesity.  Chronic history of obesity for decades. She underwent gastric roux-en-y surgery in 2015. Initial weight was 241, was able to lose down to 193, began regaining weight during subsequent years.   Over the last few years she's struggled to maintain her weight due to sugar cravings, eating carbs (breads, rice, etc), increased stress. She will eat to the point where it hurts and then stop. She has a hard time controlling her appetite and cravings.   About three weeks ago she cut out sodas, increased water intake. She began walking 30 minutes daily for 2 weeks. In the past she's tried Atkins, keto diet, calorie counting, herbal life with little to temporary improvement.   She is wanting to try GLP one agonist treatment for cravings. She has a family member who has lost 100 pounds over the last 1 year.   Diet currently consists of:  Breakfast: Eggs with veggies and cheese over last 2 weeks; before pizza, left overs, skips; crackers  Lunch: Take out fried food, left overs from the night before  Dinner: Take out food, fried food  Snacks: Candy, cookies, cakes   Desserts: Daily   Beverages: Soda, coffee, water, cocktail/beer  Over the last 2 weeks she's changed her diet to increase water, protein.  Wt Readings from Last 3 Encounters:  06/07/24 224 lb (101.6 kg)  02/19/24 206 lb (93.4 kg)  12/02/23 201 lb 12.8 oz (91.5 kg)   Body mass index is 40.32 kg/m.    Review of Systems  Respiratory:  Negative for shortness of breath.   Cardiovascular:  Negative for chest pain.  Gastrointestinal:  Positive for constipation.         Past Medical History:  Diagnosis Date   Acute sinusitis  01/30/2023   Allergy    Anxiety    Chronic cholecystitis with calculus    GERD (gastroesophageal reflux disease)    Hepatic steatosis    History of chickenpox    History of migraine    Hyperlipidemia    Hypertension    Migraines    Morbid obesity (HCC)    RUQ pain 10/27/2023   S/P gastric bypass    Sleep apnea    no CPAP needed now since gastric bypass   Vitamin D  deficiency     Social History   Socioeconomic History   Marital status: Married    Spouse name: Not on file   Number of children: Not on file   Years of education: Not on file   Highest education level: Not on file  Occupational History   Not on file  Tobacco Use   Smoking status: Never    Passive exposure: Never   Smokeless tobacco: Never  Vaping Use   Vaping status: Never Used  Substance and Sexual Activity   Alcohol use: Yes    Alcohol/week: 0.0 standard drinks of alcohol    Comment: occasional glass of wine or mixed drink 2 or 3 times a month   Drug use: No   Sexual activity: Not on file  Other Topics Concern   Not on file  Social History Narrative   Married.   1 child.   Works at [employer].   Enjoys walking,  bowling, Nascar, going to the beach.    Social Drivers of Corporate investment banker Strain: Not on file  Food Insecurity: Not on file  Transportation Needs: Not on file  Physical Activity: Not on file  Stress: Not on file  Social Connections: Unknown (05/13/2022)   Received from [facility]   Social Network    Social Network: Not on file  Intimate Partner Violence: Unknown (04/04/2022)   Received from [facility]   HITS    Physically Hurt: Not on file    Insult or Talk Down To: Not on file    Threaten Physical Harm: Not on file    Scream or Curse: Not on file    Past Surgical History:  Procedure Laterality Date   CESAREAN SECTION  05/2005   GASTRIC ROUX-EN-Y N/A 02/27/2014   Procedure: LAPAROSCOPIC ROUX-EN-Y GASTRIC BYPASS WITH UPPER ENDOSCOPY ;   Surgeon: Fran Imus, MD;  Location: [facility];  Service: General;  Laterality: N/A;   THERAPEUTIC ABORTION  1988   TUBAL LIGATION  2011    Family History  Problem Relation Age of Onset   Cancer Mother        lung   Hypertension Mother    Diabetes Mother    Emphysema Father    Heart failure Father    Cancer Maternal Aunt        breast   Colon cancer Neg Hx    Esophageal cancer Neg Hx    Rectal cancer Neg Hx    Stomach cancer Neg Hx     Allergies  Allergen Reactions   Latex     irritation   Lisinopril Hives, Itching and Swelling    Current Outpatient Medications on File Prior to Visit  Medication Sig Dispense Refill   calcium carbonate (TUMS - DOSED IN MG ELEMENTAL CALCIUM) 500 MG chewable tablet Chew 1 tablet by mouth as needed for indigestion or heartburn.     Calcium Carbonate-Vitamin D  (CALTRATE 600+D PO) Take 1 tablet by mouth 3 (three) times daily.     cetirizine (ZYRTEC) 10 MG tablet Take 10 mg by mouth at bedtime.     ferrous sulfate 325 (65 FE) MG tablet Take 325 mg by mouth daily with breakfast.     hydrochlorothiazide  (HYDRODIURIL ) 25 MG tablet TAKE 1 TABLET BY MOUTH EVERY DAY for blood pressure. 90 tablet 2   ibuprofen  (ADVIL ) 200 MG tablet Take 2 tablets (400 mg total) by mouth every 6 (six) hours as needed for moderate pain (pain score 4-6) (headaches/migraines, or surgical pain). 30 tablet 0   polyethylene glycol (MIRALAX / GLYCOLAX) 17 g packet Take 17 g by mouth daily as needed for moderate constipation.     topiramate  (TOPAMAX ) 50 MG tablet TAKE 1 TABLET BY MOUTH AT  BEDTIME FOR HEADACHE PREVENTION 90 tablet 2   amoxicillin  (AMOXIL ) 500 MG capsule Take 2 capsules (1,000 mg total) by mouth 2 (two) times daily. (Patient not taking: Reported on 06/07/2024) 40 capsule 0   guaiFENesin -codeine  100-10 MG/5ML syrup Take 5 mLs by mouth every 6 (six) hours as needed for cough. (Patient not taking: Reported on 06/07/2024) 100 mL 0   hydrOXYzine  (ATARAX ) 10 MG tablet Take 1  tablet (10 mg total) by mouth 2 (two) times daily as needed for anxiety. (Patient not taking: Reported on 06/07/2024) 30 tablet 0   No current facility-administered medications on file prior to visit.    BP 128/66   Pulse 70   Temp (!)  97 F (36.1 C) (Temporal)   Ht 5' 2.5" (1.588 m)   Wt 224 lb (101.6 kg)   LMP 01/06/2020 Comment: had a tubal  SpO2 99%   BMI 40.32 kg/m  Objective:   Physical Exam Cardiovascular:     Rate and Rhythm: Normal rate and regular rhythm.  Pulmonary:     Effort: Pulmonary effort is normal.     Breath sounds: Normal breath sounds.  Musculoskeletal:     Cervical back: Neck supple.  Skin:    General: Skin is warm and dry.  Neurological:     Mental Status: She is alert and oriented to person, place, and time.  Psychiatric:        Mood and Affect: Mood normal.           Assessment & Plan:  Class 3 severe obesity due to excess calories with body mass index (BMI) of 40.0 to 44.9 in adult Assessment & Plan: Uncontrolled with weight gain over the years.  She is aware of her terrible diet, is working on improving over the last 2 weeks. Commended her on dietary changes and increased physical activity.  We discussed options for medical management of her obesity.  Will start with Wegovy. Start semaglutide [facility]) for weight loss. Start by injecting 0.25 mg into the skin once weekly for 4 weeks, then increase to 0.5 mg once weekly thereafter.   Discussed instructions for use, common side effects.  Follow up in 3 months.  Orders: -     POCT glycosylated hemoglobin (Hb A1C) -     Semaglutide-Weight Management; Inject 0.25 mg into the skin once a week.  Dispense: 2 mL; Refill: 0        Gabriel John, NP

## 2024-06-08 ENCOUNTER — Other Ambulatory Visit (HOSPITAL_COMMUNITY): Payer: Self-pay

## 2024-06-08 ENCOUNTER — Telehealth: Payer: Self-pay

## 2024-06-08 NOTE — Telephone Encounter (Signed)
 Pharmacy Patient Advocate Encounter   Received notification from Patient Pharmacy that prior authorization for Wegovy 0.25 is required/requested.   Insurance verification completed.   The patient is insured through [facility] .   Per test claim: PA required; PA submitted to above mentioned insurance via CoverMyMeds Key/confirmation #/EOC [id number] Status is pending

## 2024-06-09 NOTE — Telephone Encounter (Signed)
 Noted

## 2024-06-09 NOTE — Telephone Encounter (Signed)
 Pharmacy Patient Advocate Encounter  Received notification from [employer] that Prior Authorization for [name] has been DENIED.  Full denial letter will be uploaded to the media tab. See denial reason below.   PA #/Case ID/Reference #: [id number]

## 2024-09-11 ENCOUNTER — Other Ambulatory Visit: Payer: Self-pay | Admitting: Primary Care

## 2024-09-11 DIAGNOSIS — I10 Essential (primary) hypertension: Secondary | ICD-10-CM

## 2024-09-12 NOTE — Telephone Encounter (Signed)
Patient is due for CPE/follow up in mid November, this will be required prior to any further refills.  Please schedule, thank you!

## 2024-12-05 ENCOUNTER — Other Ambulatory Visit: Payer: Self-pay | Admitting: Primary Care

## 2024-12-05 DIAGNOSIS — I10 Essential (primary) hypertension: Secondary | ICD-10-CM

## 2024-12-05 DIAGNOSIS — G43901 Migraine, unspecified, not intractable, with status migrainosus: Secondary | ICD-10-CM

## 2024-12-06 NOTE — Telephone Encounter (Signed)
 Patient is due for CPE/follow up, this will be required prior to any further refills.  Please schedule, thank you!

## 2025-01-02 ENCOUNTER — Ambulatory Visit: Payer: Self-pay | Admitting: Primary Care

## 2025-01-02 NOTE — Telephone Encounter (Signed)
 Patient requesting medication, denied scheduling appointment with patient access specialist   Copied from CRM [id number]. Topic: Clinical - Medical Advice >> Jan 02, 2025  8:30 AM Robinson H wrote: Reason for CRM: Patient states she's having a lot of coughing at night time to the point she sometimes chokes and not sleeping states she's been using Dayquil and Nyquil and not working. Agent offered to schedule appointment with a provider and patient declined wants to know if something can be called in.  [place] [phone]

## 2025-01-03 NOTE — Telephone Encounter (Signed)
 Patient will need an appointment as discussed.  She can try Robitussin or Delsym OTC for cough.  Happy to evaluate her at her convenience.

## 2025-01-05 NOTE — Telephone Encounter (Addendum)
 Spoke with pt relaying [name]'s message. Pt verbalizes understanding and had tried recommended OTC cough meds. Says cough has actually improved but will call to schedule OV if sxs returns or worsens.

## 2025-01-11 ENCOUNTER — Ambulatory Visit: Admitting: Primary Care

## 2025-01-11 ENCOUNTER — Ambulatory Visit: Payer: Self-pay | Admitting: Primary Care

## 2025-01-11 ENCOUNTER — Encounter: Payer: Self-pay | Admitting: Primary Care

## 2025-01-11 VITALS — BP 124/78 | HR 74 | Temp 98.0°F | Ht 62.5 in | Wt 224.6 lb

## 2025-01-11 DIAGNOSIS — Z0000 Encounter for general adult medical examination without abnormal findings: Secondary | ICD-10-CM

## 2025-01-11 DIAGNOSIS — E785 Hyperlipidemia, unspecified: Secondary | ICD-10-CM

## 2025-01-11 DIAGNOSIS — Z0001 Encounter for general adult medical examination with abnormal findings: Secondary | ICD-10-CM

## 2025-01-11 DIAGNOSIS — G43009 Migraine without aura, not intractable, without status migrainosus: Secondary | ICD-10-CM | POA: Diagnosis not present

## 2025-01-11 DIAGNOSIS — I10 Essential (primary) hypertension: Secondary | ICD-10-CM | POA: Diagnosis not present

## 2025-01-11 DIAGNOSIS — Z1231 Encounter for screening mammogram for malignant neoplasm of breast: Secondary | ICD-10-CM

## 2025-01-11 DIAGNOSIS — Z23 Encounter for immunization: Secondary | ICD-10-CM | POA: Diagnosis not present

## 2025-01-11 LAB — COMPREHENSIVE METABOLIC PANEL WITH GFR
ALT: 15 U/L (ref 3–35)
AST: 16 U/L (ref 5–37)
Albumin: 4.6 g/dL (ref 3.5–5.2)
Alkaline Phosphatase: 103 U/L (ref 39–117)
BUN: 13 mg/dL (ref 6–23)
CO2: 31 meq/L (ref 19–32)
Calcium: 10.1 mg/dL (ref 8.4–10.5)
Chloride: 100 meq/L (ref 96–112)
Creatinine, Ser: 0.75 mg/dL (ref 0.40–1.20)
GFR: 89.6 mL/min
Glucose, Bld: 86 mg/dL (ref 70–99)
Potassium: 4.6 meq/L (ref 3.5–5.1)
Sodium: 138 meq/L (ref 135–145)
Total Bilirubin: 0.4 mg/dL (ref 0.2–1.2)
Total Protein: 7.6 g/dL (ref 6.0–8.3)

## 2025-01-11 LAB — LIPID PANEL
Cholesterol: 223 mg/dL — ABNORMAL HIGH (ref 28–200)
HDL: 83.9 mg/dL
LDL Cholesterol: 120 mg/dL — ABNORMAL HIGH (ref 10–99)
NonHDL: 139.19
Total CHOL/HDL Ratio: 3
Triglycerides: 98 mg/dL (ref 10.0–149.0)
VLDL: 19.6 mg/dL (ref 0.0–40.0)

## 2025-01-11 LAB — HEMOGLOBIN A1C: Hgb A1c MFr Bld: 5.7 % (ref 4.6–6.5)

## 2025-01-11 MED ORDER — PROPRANOLOL HCL ER 80 MG PO CP24: 80.0000 mg | ORAL_CAPSULE | Freq: Every day | ORAL | 0 refills | Status: AC

## 2025-01-11 NOTE — Assessment & Plan Note (Addendum)
 Deteriorated.   Remain off Topamax  50 mg HS Start propranolol  ER 80 mg HS.

## 2025-01-11 NOTE — Addendum Note (Signed)
 Addended by: ALBINO SHAVER C on: 01/11/2025 12:40 PM   Modules accepted: Orders

## 2025-01-11 NOTE — Assessment & Plan Note (Signed)
Controlled. ? ?Continue hydrochlorothiazide 25 mg daily. ?CMP pending. ?

## 2025-01-11 NOTE — Progress Notes (Signed)
 "  Subjective:    Patient ID: [name], female    DOB: 04/17/1971, 54 y.o.   MRN: [id number]  [name] is a very pleasant 54 y.o. female who presents today for complete physical and follow up of chronic conditions.  She would also like to discuss Topamax . Currently managed on Topamax  50 mg HS for migraine prevention. She stopped taking for 2 months due to symptoms of cloudy feelings and not performing well at work. Since discontinuation she's noticed a resolve in her cloudy symptoms but headaches have returned and are worse. She would like to try something.   Immunizations: -Tetanus: Completed in 2017 -Influenza: Influenza vaccine provided today.   -Shingles: Completed Shingrix  series  Diet: Fair diet.  Exercise: No regular exercise.  Eye exam: Completes annually  Dental exam: Completed> 1 year ago  Pap Smear: Completes with GYN Mammogram: Completed in September 2023   Colonoscopy: Completed in 2021, due 2031    BP Readings from Last 3 Encounters:  01/11/25 124/78  06/07/24 128/66  12/02/23 138/84      Review of Systems  Constitutional:  Negative for unexpected weight change.  HENT:  Negative for rhinorrhea.   Respiratory:  Negative for cough and shortness of breath.   Cardiovascular:  Negative for chest pain.  Gastrointestinal:  Negative for constipation and diarrhea.  Genitourinary:  Negative for difficulty urinating.  Musculoskeletal:  Negative for arthralgias and myalgias.  Skin:  Negative for rash.  Allergic/Immunologic: Negative for environmental allergies.  Neurological:  Positive for headaches. Negative for dizziness.  Psychiatric/Behavioral:  The patient is not nervous/anxious.          Past Medical History:  Diagnosis Date   Acute sinusitis 01/30/2023   Allergy    Anxiety    Chronic cholecystitis with calculus    GERD (gastroesophageal reflux disease)    Hepatic steatosis    History of chickenpox    History of migraine     Hyperlipidemia    Hypertension    Migraines    Morbid obesity (HCC)    RUQ pain 10/27/2023   S/P gastric bypass    Sleep apnea    no CPAP needed now since gastric bypass   Vitamin D  deficiency     Social History   Socioeconomic History   Marital status: Married    Spouse name: Not on file   Number of children: Not on file   Years of education: Not on file   Highest education level: Not on file  Occupational History   Not on file  Tobacco Use   Smoking status: Never    Passive exposure: Never   Smokeless tobacco: Never  Vaping Use   Vaping status: Never Used  Substance and Sexual Activity   Alcohol use: Yes    Alcohol/week: 0.0 standard drinks of alcohol    Comment: occasional glass of wine or mixed drink 2 or 3 times a month   Drug use: No   Sexual activity: Not on file  Other Topics Concern   Not on file  Social History Narrative   Married.   1 child.   Works at [employer].   Enjoys walking, bowling, Nascar, going to the beach.    Social Drivers of Health   Tobacco Use: Low Risk (01/11/2025)   Patient History    Smoking Tobacco Use: Never    Smokeless Tobacco Use: Never    Passive Exposure: Never  Financial Resource Strain: Not on file  Food Insecurity: Not on file  Transportation Needs: Not on file  Physical Activity: Not on file  Stress: Not on file  Social Connections: Unknown (05/13/2022)   Received from [facility]   Social Network    Social Network: Not on file  Intimate Partner Violence: Unknown (04/04/2022)   Received from [facility]   HITS    Physically Hurt: Not on file    Insult or Talk Down To: Not on file    Threaten Physical Harm: Not on file    Scream or Curse: Not on file  Depression (PHQ2-9): Medium Risk (01/11/2025)   Depression (PHQ2-9)    PHQ-2 Score: 8  Alcohol Screen: Not on file  Housing: Not on file  Utilities: Not on file  Health Literacy: Not on file    Past Surgical History:  Procedure Laterality Date   CESAREAN  SECTION  05/2005   GASTRIC ROUX-EN-Y N/A 02/27/2014   Procedure: LAPAROSCOPIC ROUX-EN-Y GASTRIC BYPASS WITH UPPER ENDOSCOPY ;  Surgeon: Camellia CHRISTELLA Blush, MD;  Location: [facility];  Service: General;  Laterality: N/A;   THERAPEUTIC ABORTION  1988   TUBAL LIGATION  2011    Family History  Problem Relation Age of Onset   Cancer Mother        lung   Hypertension Mother    Diabetes Mother    Emphysema Father    Heart failure Father    Cancer Maternal Aunt        breast   Colon cancer Neg Hx    Esophageal cancer Neg Hx    Rectal cancer Neg Hx    Stomach cancer Neg Hx     Allergies[1]  Medications Ordered Prior to Encounter[2]  BP 124/78 (BP Location: Left Arm, Patient Position: Sitting, Cuff Size: Large)   Pulse 74   Temp 98 F (36.7 C) (Temporal)   Ht 5' 2.5 (1.588 m)   Wt 224 lb 9.6 oz (101.9 kg)   LMP 01/06/2020 Comment: had a tubal  SpO2 98%   BMI 40.43 kg/m  Objective:   Physical Exam HENT:     Right Ear: Tympanic membrane and ear canal normal.     Left Ear: Tympanic membrane and ear canal normal.  Eyes:     Pupils: Pupils are equal, round, and reactive to light.  Cardiovascular:     Rate and Rhythm: Normal rate and regular rhythm.  Pulmonary:     Effort: Pulmonary effort is normal.     Breath sounds: Normal breath sounds.  Abdominal:     General: Bowel sounds are normal.     Palpations: Abdomen is soft.     Tenderness: There is no abdominal tenderness.  Musculoskeletal:        General: Normal range of motion.     Cervical back: Neck supple.  Skin:    General: Skin is warm and dry.  Neurological:     Mental Status: She is alert and oriented to person, place, and time.     Cranial Nerves: No cranial nerve deficit.     Deep Tendon Reflexes:     Reflex Scores:      Patellar reflexes are 2+ on the right side and 2+ on the left side. Psychiatric:        Mood and Affect: Mood normal.     Physical Exam        Assessment & Plan:  Encounter for annual general  medical examination with abnormal findings in adult Assessment & Plan: Immunizations UTD. Influenza vaccine provided today.  Pap smear UTD.  Follows with GYN Mammogram due, orders placed. Colonoscopy UTD, due 203  Discussed the importance of a healthy diet and regular exercise in order for weight loss, and to reduce the risk of further co-morbidity.  Exam stable. Labs pending.  Follow up in 1 year for repeat physical.    Screening mammogram for breast cancer -     3D Screening Mammogram, Left and Right; Future  Migraine without aura and without status migrainosus, not intractable Assessment & Plan: Deteriorated.   Remain off Topamax  50 mg HS Start propranolol  ER 80 mg HS.  Orders: -     Propranolol  HCl ER; Take 1 capsule (80 mg total) by mouth at bedtime. For headache prevention  Dispense: 90 capsule; Refill: 0  Primary hypertension Assessment & Plan: Controlled.  Continue hydrochlorothiazide  25 mg daily.  CMP pending.  Orders: -     Hemoglobin A1c -     Comprehensive metabolic panel with GFR  Hyperlipidemia, unspecified hyperlipidemia type Assessment & Plan: Repeat lipid panel pending.  Work on a healthy diet and regular exercise in order for weight loss, and to reduce the risk of further co-morbidity.   Orders: -     Lipid panel    Assessment and Plan Assessment & Plan         Comer MARLA Gaskins, NP       [1]  Allergies Allergen Reactions   Latex     irritation   Lisinopril Hives, Itching and Swelling  [2]  Current Outpatient Medications on File Prior to Visit  Medication Sig Dispense Refill   calcium carbonate (TUMS - DOSED IN MG ELEMENTAL CALCIUM) 500 MG chewable tablet Chew 1 tablet by mouth as needed for indigestion or heartburn.     Calcium Carbonate-Vitamin D  (CALTRATE 600+D PO) Take 1 tablet by mouth 3 (three) times daily.     cetirizine (ZYRTEC) 10 MG tablet Take 10 mg by mouth at bedtime.     ferrous sulfate 325 (65 FE) MG  tablet Take 325 mg by mouth daily with breakfast.     hydrochlorothiazide  (HYDRODIURIL ) 25 MG tablet TAKE 1 TABLET BY MOUTH DAILY FOR BLOOD PRESSURE 30 tablet 0   hydrOXYzine  (ATARAX ) 10 MG tablet Take 1 tablet (10 mg total) by mouth 2 (two) times daily as needed for anxiety. 30 tablet 0   ibuprofen  (ADVIL ) 200 MG tablet Take 2 tablets (400 mg total) by mouth every 6 (six) hours as needed for moderate pain (pain score 4-6) (headaches/migraines, or surgical pain). 30 tablet 0   polyethylene glycol (MIRALAX / GLYCOLAX) 17 g packet Take 17 g by mouth daily as needed for moderate constipation.     No current facility-administered medications on file prior to visit.   "

## 2025-01-11 NOTE — Assessment & Plan Note (Signed)
 Repeat lipid panel pending.  Work on a healthy diet and regular exercise in order for weight loss, and to reduce the risk of further co-morbidity.

## 2025-01-11 NOTE — Assessment & Plan Note (Signed)
 Immunizations UTD. Influenza vaccine provided today.  Pap smear UTD. Follows with GYN Mammogram due, orders placed. Colonoscopy UTD, due 203  Discussed the importance of a healthy diet and regular exercise in order for weight loss, and to reduce the risk of further co-morbidity.  Exam stable. Labs pending.  Follow up in 1 year for repeat physical.

## 2025-01-11 NOTE — Patient Instructions (Signed)
 Stop by the lab prior to leaving today. I will notify you of your results once received.   Start propranolol  ER 80 mg at bedtime for headache prevention.  [facility].   It was a pleasure to see you today!

## 2025-01-12 ENCOUNTER — Encounter: Admitting: Primary Care

## 2025-01-18 ENCOUNTER — Other Ambulatory Visit: Payer: Self-pay | Admitting: Primary Care

## 2025-01-18 DIAGNOSIS — G43901 Migraine, unspecified, not intractable, with status migrainosus: Secondary | ICD-10-CM

## 2025-01-18 DIAGNOSIS — I10 Essential (primary) hypertension: Secondary | ICD-10-CM

## 2025-02-10 ENCOUNTER — Encounter
